# Patient Record
Sex: Female | Born: 1990 | Race: Black or African American | Hispanic: No | Marital: Single | Smoking: Former smoker
Health system: Southern US, Community
[De-identification: ages and names within clinical notes are randomized; demographics above are authoritative.]

## PROBLEM LIST (undated history)

## (undated) ENCOUNTER — Inpatient Hospital Stay (HOSPITAL_COMMUNITY): Payer: Self-pay

## (undated) DIAGNOSIS — A749 Chlamydial infection, unspecified: Secondary | ICD-10-CM

## (undated) DIAGNOSIS — B999 Unspecified infectious disease: Secondary | ICD-10-CM

## (undated) HISTORY — PX: INDUCED ABORTION: SHX677

---
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
## 2010-06-19 ENCOUNTER — Emergency Department (HOSPITAL_COMMUNITY): Payer: Self-pay

## 2010-06-19 ENCOUNTER — Emergency Department (HOSPITAL_COMMUNITY)
Admission: EM | Admit: 2010-06-19 | Discharge: 2010-06-19 | Disposition: A | Discharge status: Home / Self Care | Payer: Self-pay | Attending: Emergency Medicine | Admitting: Emergency Medicine

## 2010-06-19 DIAGNOSIS — J45909 Unspecified asthma, uncomplicated: Secondary | ICD-10-CM | POA: Insufficient documentation

## 2010-06-19 DIAGNOSIS — R112 Nausea with vomiting, unspecified: Secondary | ICD-10-CM | POA: Insufficient documentation

## 2010-06-19 DIAGNOSIS — R51 Headache: Secondary | ICD-10-CM | POA: Insufficient documentation

## 2010-06-19 DIAGNOSIS — R300 Dysuria: Secondary | ICD-10-CM | POA: Insufficient documentation

## 2010-06-19 DIAGNOSIS — F319 Bipolar disorder, unspecified: Secondary | ICD-10-CM | POA: Insufficient documentation

## 2010-06-19 DIAGNOSIS — R109 Unspecified abdominal pain: Secondary | ICD-10-CM | POA: Insufficient documentation

## 2010-06-19 DIAGNOSIS — H53149 Visual discomfort, unspecified: Secondary | ICD-10-CM | POA: Insufficient documentation

## 2010-06-19 LAB — URINALYSIS, ROUTINE W REFLEX MICROSCOPIC
Glucose, UA: NEGATIVE mg/dL
Specific Gravity, Urine: 1.015 (ref 1.005–1.030)

## 2010-06-19 LAB — URINE MICROSCOPIC-ADD ON

## 2010-06-19 LAB — DIFFERENTIAL
Basophils Relative: 0 % (ref 0–1)
Eosinophils Absolute: 0.1 10*3/uL (ref 0.0–0.7)
Neutrophils Relative %: 48 % (ref 43–77)

## 2010-06-19 LAB — CBC
MCH: 31 pg (ref 26.0–34.0)
Platelets: 258 10*3/uL (ref 150–400)
RBC: 4.23 MIL/uL (ref 3.87–5.11)
WBC: 6.7 10*3/uL (ref 4.0–10.5)

## 2010-06-19 LAB — BASIC METABOLIC PANEL
Chloride: 101 mEq/L (ref 96–112)
Creatinine, Ser: 0.66 mg/dL (ref 0.4–1.2)
GFR calc Af Amer: >60 mL/min (ref >60)
Potassium: 3.8 mEq/L (ref 3.5–5.1)

## 2010-06-20 LAB — URINE CULTURE

## 2010-06-24 LAB — URINALYSIS, ROUTINE W REFLEX MICROSCOPIC
Bilirubin Urine: NEGATIVE
Glucose, UA: NEGATIVE mg/dL
Specific Gravity, Urine: 1.005 (ref 1.005–1.030)
Urobilinogen, UA: 0.2 mg/dL (ref 0.0–1.0)

## 2010-06-24 LAB — COMPREHENSIVE METABOLIC PANEL
AST: 16 U/L (ref 0–37)
Albumin: 4.4 g/dL (ref 3.5–5.2)
Alkaline Phosphatase: 43 U/L (ref 39–117)
Chloride: 103 mEq/L (ref 96–112)
GFR calc Af Amer: >60 mL/min (ref >60)
Potassium: 3.2 mEq/L — ABNORMAL LOW (ref 3.5–5.1)
Sodium: 139 mEq/L (ref 135–145)
Total Bilirubin: 0.4 mg/dL (ref 0.3–1.2)
Total Protein: 7.5 g/dL (ref 6.0–8.3)

## 2010-06-24 LAB — CBC
MCV: 91.5 fL (ref 78.0–100.0)
Platelets: 212 10*3/uL (ref 150–400)
RBC: 3.79 MIL/uL — ABNORMAL LOW (ref 3.87–5.11)
WBC: 6.4 10*3/uL (ref 4.0–10.5)

## 2010-06-24 LAB — DIFFERENTIAL
Basophils Absolute: 0 10*3/uL (ref 0.0–0.1)
Basophils Relative: 0 % (ref 0–1)
Eosinophils Relative: 2 % (ref 0–5)
Monocytes Absolute: 0.5 10*3/uL (ref 0.1–1.0)
Monocytes Relative: 8 % (ref 3–12)

## 2010-06-24 LAB — URINE MICROSCOPIC-ADD ON

## 2010-06-24 LAB — POCT PREGNANCY, URINE: Preg Test, Ur: NEGATIVE

## 2010-06-25 LAB — URINALYSIS, ROUTINE W REFLEX MICROSCOPIC
Glucose, UA: NEGATIVE mg/dL
Ketones, ur: NEGATIVE mg/dL
Protein, ur: NEGATIVE mg/dL
pH: 7 (ref 5.0–8.0)

## 2010-06-25 LAB — CBC
Hemoglobin: 12.6 g/dL (ref 12.0–15.0)
MCH: 31.2 pg (ref 26.0–34.0)
MCV: 93.1 fL (ref 78.0–100.0)
RBC: 4.03 MIL/uL (ref 3.87–5.11)

## 2010-06-25 LAB — BASIC METABOLIC PANEL
CO2: 29 mEq/L (ref 19–32)
Chloride: 103 mEq/L (ref 96–112)
Creatinine, Ser: 0.56 mg/dL (ref 0.4–1.2)
GFR calc Af Amer: >60 mL/min (ref >60)
Sodium: 137 mEq/L (ref 135–145)

## 2010-06-25 LAB — DIFFERENTIAL
Eosinophils Absolute: 0 10*3/uL (ref 0.0–0.7)
Eosinophils Relative: 1 % (ref 0–5)
Lymphs Abs: 1.6 10*3/uL (ref 0.7–4.0)
Monocytes Relative: 3 % (ref 3–12)

## 2010-06-25 LAB — URINE CULTURE

## 2010-06-25 LAB — URINE MICROSCOPIC-ADD ON

## 2010-07-16 LAB — URINE MICROSCOPIC-ADD ON

## 2010-07-16 LAB — RAPID STREP SCREEN (MED CTR MEBANE ONLY): Streptococcus, Group A Screen (Direct): NEGATIVE

## 2010-07-16 LAB — STREP A DNA PROBE

## 2010-07-16 LAB — URINALYSIS, ROUTINE W REFLEX MICROSCOPIC
Nitrite: NEGATIVE
Specific Gravity, Urine: 1.015 (ref 1.005–1.030)
Urobilinogen, UA: 0.2 mg/dL (ref 0.0–1.0)

## 2010-07-16 LAB — PREGNANCY, URINE: Preg Test, Ur: NEGATIVE

## 2010-07-20 LAB — URINALYSIS, ROUTINE W REFLEX MICROSCOPIC
Bilirubin Urine: NEGATIVE
Glucose, UA: NEGATIVE mg/dL
Protein, ur: 100 mg/dL — AB

## 2010-07-20 LAB — URINE CULTURE

## 2010-07-20 LAB — URINE MICROSCOPIC-ADD ON

## 2010-07-26 LAB — URINALYSIS, ROUTINE W REFLEX MICROSCOPIC
Nitrite: NEGATIVE
Specific Gravity, Urine: 1.01 (ref 1.005–1.030)
Urobilinogen, UA: 0.2 mg/dL (ref 0.0–1.0)

## 2010-07-26 LAB — DIFFERENTIAL
Basophils Relative: 0 % (ref 0–1)
Eosinophils Absolute: 0.1 10*3/uL (ref 0.0–1.2)
Eosinophils Relative: 1 % (ref 0–5)
Monocytes Absolute: 0.6 10*3/uL (ref 0.2–1.2)
Monocytes Relative: 8 % (ref 3–11)

## 2010-07-26 LAB — CBC
Hemoglobin: 13.8 g/dL (ref 12.0–16.0)
MCHC: 33.6 g/dL (ref 31.0–37.0)
MCV: 92.4 fL (ref 78.0–98.0)
RBC: 4.45 MIL/uL (ref 3.80–5.70)

## 2010-07-26 LAB — BASIC METABOLIC PANEL
CO2: 29 mEq/L (ref 19–32)
Chloride: 101 mEq/L (ref 96–112)
Creatinine, Ser: 0.63 mg/dL (ref 0.4–1.2)
Sodium: 136 mEq/L (ref 135–145)

## 2010-07-26 LAB — RAPID URINE DRUG SCREEN, HOSP PERFORMED: Tetrahydrocannabinol: NOT DETECTED

## 2010-07-26 LAB — PREGNANCY, URINE: Preg Test, Ur: NEGATIVE

## 2010-07-26 LAB — URINE MICROSCOPIC-ADD ON

## 2010-07-27 ENCOUNTER — Emergency Department (HOSPITAL_COMMUNITY)
Admission: EM | Admit: 2010-07-27 | Discharge: 2010-07-27 | Disposition: A | Discharge status: Home / Self Care | Payer: Self-pay | Attending: Emergency Medicine | Admitting: Emergency Medicine

## 2010-07-27 ENCOUNTER — Emergency Department (HOSPITAL_COMMUNITY): Payer: Self-pay

## 2010-07-27 DIAGNOSIS — R071 Chest pain on breathing: Secondary | ICD-10-CM | POA: Insufficient documentation

## 2010-07-27 DIAGNOSIS — R079 Chest pain, unspecified: Secondary | ICD-10-CM | POA: Insufficient documentation

## 2010-07-27 LAB — DIFFERENTIAL
Basophils Absolute: 0 10*3/uL (ref 0.0–0.1)
Basophils Relative: 0 % (ref 0–1)
Eosinophils Relative: 2 % (ref 0–5)
Lymphocytes Relative: 50 % — ABNORMAL HIGH (ref 12–46)
Monocytes Absolute: 0.6 10*3/uL (ref 0.1–1.0)
Monocytes Relative: 10 % (ref 3–12)

## 2010-07-27 LAB — BASIC METABOLIC PANEL
CO2: 25 mEq/L (ref 19–32)
Chloride: 101 mEq/L (ref 96–112)
GFR calc Af Amer: >60 mL/min (ref >60)
Potassium: 3.4 mEq/L — ABNORMAL LOW (ref 3.5–5.1)
Sodium: 134 mEq/L — ABNORMAL LOW (ref 135–145)

## 2010-07-27 LAB — CBC
HCT: 36.1 % (ref 36.0–46.0)
MCH: 30.7 pg (ref 26.0–34.0)
MCHC: 34.1 g/dL (ref 30.0–36.0)
RDW: 12.3 % (ref 11.5–15.5)

## 2010-07-27 LAB — URINE MICROSCOPIC-ADD ON

## 2010-07-27 LAB — URINALYSIS, ROUTINE W REFLEX MICROSCOPIC
Glucose, UA: NEGATIVE mg/dL
Leukocytes, UA: NEGATIVE
Protein, ur: NEGATIVE mg/dL
Specific Gravity, Urine: <1.005 — ABNORMAL LOW (ref 1.005–1.030)
pH: 6.5 (ref 5.0–8.0)

## 2010-07-27 LAB — HEPATIC FUNCTION PANEL
Albumin: 4.4 g/dL (ref 3.5–5.2)
Total Protein: 7.2 g/dL (ref 6.0–8.3)

## 2010-07-27 LAB — POCT CARDIAC MARKERS: Myoglobin, poc: 45.4 ng/mL (ref 12–200)

## 2010-07-27 LAB — LIPASE, BLOOD: Lipase: 26 U/L (ref 11–59)

## 2010-09-07 ENCOUNTER — Emergency Department (HOSPITAL_COMMUNITY)
Admission: EM | Admit: 2010-09-07 | Discharge: 2010-09-08 | Discharge status: Against Medical Advice | Payer: Self-pay | Attending: Emergency Medicine | Admitting: Emergency Medicine

## 2010-09-07 DIAGNOSIS — L259 Unspecified contact dermatitis, unspecified cause: Secondary | ICD-10-CM | POA: Insufficient documentation

## 2010-09-07 DIAGNOSIS — F319 Bipolar disorder, unspecified: Secondary | ICD-10-CM | POA: Insufficient documentation

## 2010-09-07 DIAGNOSIS — T63461A Toxic effect of venom of wasps, accidental (unintentional), initial encounter: Secondary | ICD-10-CM | POA: Insufficient documentation

## 2010-09-07 DIAGNOSIS — T6391XA Toxic effect of contact with unspecified venomous animal, accidental (unintentional), initial encounter: Secondary | ICD-10-CM | POA: Insufficient documentation

## 2010-09-07 DIAGNOSIS — J45909 Unspecified asthma, uncomplicated: Secondary | ICD-10-CM | POA: Insufficient documentation

## 2010-09-08 ENCOUNTER — Emergency Department (HOSPITAL_COMMUNITY)
Admission: EM | Admit: 2010-09-08 | Discharge: 2010-09-08 | Disposition: A | Discharge status: Home / Self Care | Payer: Self-pay | Attending: Emergency Medicine | Admitting: Emergency Medicine

## 2010-09-08 DIAGNOSIS — T63461A Toxic effect of venom of wasps, accidental (unintentional), initial encounter: Secondary | ICD-10-CM | POA: Insufficient documentation

## 2010-09-08 DIAGNOSIS — L259 Unspecified contact dermatitis, unspecified cause: Secondary | ICD-10-CM | POA: Insufficient documentation

## 2010-09-08 DIAGNOSIS — T6391XA Toxic effect of contact with unspecified venomous animal, accidental (unintentional), initial encounter: Secondary | ICD-10-CM | POA: Insufficient documentation

## 2011-01-06 LAB — URINALYSIS, ROUTINE W REFLEX MICROSCOPIC
Bilirubin Urine: NEGATIVE
Glucose, UA: NEGATIVE
Ketones, ur: NEGATIVE
Protein, ur: NEGATIVE

## 2011-01-06 LAB — URINE MICROSCOPIC-ADD ON

## 2011-02-19 ENCOUNTER — Encounter (HOSPITAL_COMMUNITY): Payer: Self-pay

## 2011-02-19 ENCOUNTER — Inpatient Hospital Stay (HOSPITAL_COMMUNITY)
Admission: AD | Admit: 2011-02-19 | Discharge: 2011-02-19 | Disposition: A | Discharge status: Home / Self Care | Payer: Self-pay | Source: Ambulatory Visit | Attending: Obstetrics & Gynecology | Admitting: Obstetrics & Gynecology

## 2011-02-19 DIAGNOSIS — B373 Candidiasis of vulva and vagina: Secondary | ICD-10-CM

## 2011-02-19 DIAGNOSIS — N949 Unspecified condition associated with female genital organs and menstrual cycle: Secondary | ICD-10-CM | POA: Insufficient documentation

## 2011-02-19 DIAGNOSIS — B3731 Acute candidiasis of vulva and vagina: Secondary | ICD-10-CM | POA: Insufficient documentation

## 2011-02-19 LAB — URINALYSIS, ROUTINE W REFLEX MICROSCOPIC
Bilirubin Urine: NEGATIVE
Nitrite: NEGATIVE
Specific Gravity, Urine: 1.02 (ref 1.005–1.030)
Urobilinogen, UA: 0.2 mg/dL (ref 0.0–1.0)

## 2011-02-19 LAB — WET PREP, GENITAL
Clue Cells Wet Prep HPF POC: NONE SEEN
Trich, Wet Prep: NONE SEEN
Yeast Wet Prep HPF POC: NONE SEEN

## 2011-02-19 LAB — URINE MICROSCOPIC-ADD ON

## 2011-02-19 LAB — POCT PREGNANCY, URINE: Preg Test, Ur: NEGATIVE

## 2011-02-19 MED ORDER — FLUCONAZOLE 150 MG PO TABS: ORAL_TABLET | ORAL | Status: DC

## 2011-02-19 NOTE — ED Provider Notes (Signed)
History     CSN: [id number] Arrival date & time: 02/19/2011  1:28 PM   None     Chief Complaint  Patient presents with  . Vaginal Pain    (Consider location/radiation/quality/duration/timing/severity/associated sxs/prior treatment) HPI ALEJANDRO ADCOX is a 20 y.o.  G1P0010. She presents with c/o perineal pain. Feels sharp pain in vagina since last intercourse 2 da go. Same partner x 2 yr. No change in discharge, odor or itching. Has frequency and burning.  Past Medical History  Diagnosis Date  . No pertinent past medical history     No past surgical history on file.  No family history on file.  History  Substance Use Topics  . Smoking status: Not on file  . Smokeless tobacco: Not on file  . Alcohol Use:     OB History    Grav Para Term Preterm Abortions TAB SAB Ect Mult Living   1    1 1           Review of Systems  Constitutional: Negative for fever and chills.  Gastrointestinal: Negative.   Genitourinary: Positive for dysuria, frequency and vaginal pain. Negative for vaginal bleeding, vaginal discharge, genital sores and dyspareunia.    Allergies  Review of patient's allergies indicates not on file.  Home Medications  No current outpatient prescriptions on file.  BP 104/74  Pulse 78  Temp(Src) 98.8 F (37.1 C) (Oral)  Ht 5' 4.75" (1.645 m)  Wt 51.71 kg (114 lb)  BMI 19.12 kg/m2  LMP 02/16/2011  Physical Exam  Constitutional: She is oriented to person, place, and time. She appears well-developed and well-nourished.  Abdominal: Soft. There is no tenderness.  Genitourinary: There is erythema and tenderness around the vagina. No foreign body around the vagina.       Multiple tender linear abrasions anterior to clitoris. Unable to insert speculum fully due to discomfort and guarding. Large amt thick chunky white discharge in vagina.  Neurological: She is alert and oriented to person, place, and time.  Skin: Skin is warm and dry.  Psychiatric: She has a  normal mood and affect. Her behavior is normal.    ED Course  Procedures (including critical care time)   Labs Reviewed  POCT PREGNANCY, URINE  POCT URINE PREGNANCY  URINALYSIS, ROUTINE W REFLEX MICROSCOPIC  WET PREP, GENITAL  GC/CHLAMYDIA PROBE AMP, GENITAL   No results found. Results for orders placed during the hospital encounter of 02/19/11 (from the past 24 hour(s))  URINALYSIS, ROUTINE W REFLEX MICROSCOPIC     Status: Abnormal   Collection Time   02/19/11  1:45 PM      Component Value Range   Color, Urine YELLOW  YELLOW    Appearance CLEAR  CLEAR    Specific Gravity, Urine 1.020  1.005 - 1.030    pH 7.0  5.0 - 8.0    Glucose, UA NEGATIVE  NEGATIVE (mg/dL)   Hgb urine dipstick TRACE (*) NEGATIVE    Bilirubin Urine NEGATIVE  NEGATIVE    Ketones, ur NEGATIVE  NEGATIVE (mg/dL)   Protein, ur NEGATIVE  NEGATIVE (mg/dL)   Urobilinogen, UA 0.2  0.0 - 1.0 (mg/dL)   Nitrite NEGATIVE  NEGATIVE    Leukocytes, UA TRACE (*) NEGATIVE   URINE MICROSCOPIC-ADD ON     Status: Abnormal   Collection Time   02/19/11  1:45 PM      Component Value Range   Squamous Epithelial / LPF FEW (*) RARE    WBC, UA 3-6  <3 (WBC/hpf)  RBC / HPF 0-2  <3 (RBC/hpf)   Bacteria, UA FEW (*) RARE   POCT PREGNANCY, URINE     Status: Normal   Collection Time   02/19/11  1:53 PM      Component Value Range   Preg Test, Ur NEGATIVE    WET PREP, GENITAL     Status: Abnormal   Collection Time   02/19/11  2:05 PM      Component Value Range   Yeast, Wet Prep NONE SEEN  NONE SEEN    Trich, Wet Prep NONE SEEN  NONE SEEN    Clue Cells, Wet Prep NONE SEEN  NONE SEEN    WBC, Wet Prep HPF POC MODERATE (*) NONE SEEN       No diagnosis found. ASSESSMENT:  Symptomatic yeast STD cultures pending  PLAN:  Difulcan 150 mg 1 po, repeat in 3 days if needed. Pt to call HD in [place] to have Implanon removed and replaced.     MDM          Avon Gully. Davonna Ertl 02/19/11 1514

## 2011-02-19 NOTE — Progress Notes (Signed)
Patient had recent intercourse feels like vaginal area is split, very irritated, same partner, no discharge.

## 2011-02-19 NOTE — Progress Notes (Signed)
Patient reports has Implanon and this past month had bleeding 3 times, has had in place for 3 years.

## 2011-02-22 LAB — GC/CHLAMYDIA PROBE AMP, URINE: GC Probe Amp, Urine: NEGATIVE

## 2011-02-23 ENCOUNTER — Telehealth (HOSPITAL_COMMUNITY): Payer: Self-pay | Admitting: *Deleted

## 2011-02-23 NOTE — Telephone Encounter (Signed)
Patient returned call, notified her of positive chlamydia culture.  Instructed patient to schedule treatment with [facility] at [phone].  Instructed patient to notify her partner for treatment.  

## 2011-02-23 NOTE — Telephone Encounter (Signed)
Telephone call to patient regarding positive chlamydia culture, patient not in, left message for patient to call.  Patient has not been treated and will need referral to [facility] at [phone].  Report faxed to health department.

## 2011-04-26 ENCOUNTER — Emergency Department (HOSPITAL_COMMUNITY)
Admission: EM | Admit: 2011-04-26 | Discharge: 2011-04-26 | Disposition: A | Discharge status: Home / Self Care | Payer: Self-pay | Attending: Emergency Medicine | Admitting: Emergency Medicine

## 2011-04-26 ENCOUNTER — Encounter (HOSPITAL_COMMUNITY): Payer: Self-pay | Admitting: *Deleted

## 2011-04-26 DIAGNOSIS — S139XXA Sprain of joints and ligaments of unspecified parts of neck, initial encounter: Secondary | ICD-10-CM | POA: Insufficient documentation

## 2011-04-26 DIAGNOSIS — T733XXA Exhaustion due to excessive exertion, initial encounter: Secondary | ICD-10-CM | POA: Insufficient documentation

## 2011-04-26 DIAGNOSIS — M25519 Pain in unspecified shoulder: Secondary | ICD-10-CM | POA: Insufficient documentation

## 2011-04-26 DIAGNOSIS — R209 Unspecified disturbances of skin sensation: Secondary | ICD-10-CM | POA: Insufficient documentation

## 2011-04-26 DIAGNOSIS — S161XXA Strain of muscle, fascia and tendon at neck level, initial encounter: Secondary | ICD-10-CM

## 2011-04-26 DIAGNOSIS — R51 Headache: Secondary | ICD-10-CM | POA: Insufficient documentation

## 2011-04-26 DIAGNOSIS — IMO0001 Reserved for inherently not codable concepts without codable children: Secondary | ICD-10-CM | POA: Insufficient documentation

## 2011-04-26 LAB — POCT PREGNANCY, URINE: Preg Test, Ur: NEGATIVE

## 2011-04-26 MED ORDER — KETOROLAC TROMETHAMINE 60 MG/2ML IM SOLN
60.0000 mg | Freq: Once | INTRAMUSCULAR | Status: AC
  Administered 2011-04-26: 60 mg via INTRAMUSCULAR
  Filled 2011-04-26: qty 2

## 2011-04-26 MED ORDER — IBUPROFEN 600 MG PO TABS: 600.0000 mg | ORAL_TABLET | Freq: Four times a day (QID) | ORAL | Status: AC | PRN

## 2011-04-26 MED ORDER — CYCLOBENZAPRINE HCL 5 MG PO TABS: 5.0000 mg | ORAL_TABLET | Freq: Three times a day (TID) | ORAL | Status: AC | PRN

## 2011-04-26 NOTE — ED Notes (Signed)
C/o generalized body aches for 3 days and HA

## 2011-04-26 NOTE — ED Provider Notes (Signed)
History     CSN: [id number]  Arrival date & time 04/26/11  Rickey Primus   First MD Initiated Contact with Patient 04/26/11 1833      Chief Complaint  Patient presents with  . Generalized Body Aches  . Headache    (Consider location/radiation/quality/duration/timing/severity/associated sxs/prior treatment) HPI Comments: Patient presents with a three-day history of generalized myalgias, but her symptoms are most problematic in her left neck and shoulder area. She describes radiation of a burning/tingling sensation that radiates into her left upper arm. She denies neck injury or pain, but has noticed muscle pain in her left shoulder area. She denies trauma, fevers, weakness or numbness in her upper extremities. She'll also describes generalized mild headache. Denies fever, cough, congestion or other URI type symptoms. She does report having increased physical stress at work, and has been working 10 hour shifts 7 days a week for the past several months. Denies chest pain, shortness of breath, nausea and vomiting. She has taken ibuprofen with no relief of symptoms. Rest improves her pain, worsened with movement and range of motion of her neck and left shoulder.  The history is provided by the patient.    Past Medical History  Diagnosis Date  . No pertinent past medical history     History reviewed. No pertinent past surgical history.  Family History  Problem Relation Age of Onset  . Stroke Mother   . Hypertension Father   . Stroke Father   . Diabetes Father     History  Substance Use Topics  . Smoking status: Never Smoker   . Smokeless tobacco: Never Used  . Alcohol Use: No    OB History    Grav Para Term Preterm Abortions TAB SAB Ect Mult Living   1    1 1           Review of Systems  Constitutional: Negative for fever.  HENT: Negative for congestion, sore throat and neck pain.   Eyes: Negative.   Respiratory: Negative for chest tightness and shortness of breath.     Cardiovascular: Negative for chest pain.  Gastrointestinal: Negative for nausea and abdominal pain.  Genitourinary: Negative.   Musculoskeletal: Positive for arthralgias. Negative for joint swelling.  Skin: Negative.  Negative for rash and wound.  Neurological: Negative for dizziness, weakness, light-headedness, numbness and headaches.  Hematological: Negative.   Psychiatric/Behavioral: Negative.     Allergies  Review of patient's allergies indicates no known allergies.  Home Medications   Current Outpatient Rx  Name Route Sig Dispense Refill  . ASPIRIN 325 MG PO TABS Oral Take 325 mg by mouth as needed. For pain    . IBUPROFEN 200 MG PO TABS Oral Take 800 mg by mouth daily as needed. For pain    . CYCLOBENZAPRINE HCL 5 MG PO TABS Oral Take 1 tablet (5 mg total) by mouth 3 (three) times daily as needed for muscle spasms. 20 tablet 0  . IBUPROFEN 600 MG PO TABS Oral Take 1 tablet (600 mg total) by mouth every 6 (six) hours as needed for pain. 30 tablet 0    BP 141/95  Pulse 94  Temp(Src) 98.1 F (36.7 C) (Oral)  Resp 18  SpO2 100%  LMP 04/22/2011  Physical Exam  Nursing note and vitals reviewed. Constitutional: She is oriented to person, place, and time. She appears well-developed and well-nourished.       Uncomfortable appearing  HENT:  Head: Normocephalic and atraumatic.  Mouth/Throat: Oropharynx is clear and moist.  Eyes: EOM  are normal. Pupils are equal, round, and reactive to light.  Neck: Normal range of motion. Neck supple.  Cardiovascular: Normal rate and normal heart sounds.   Pulmonary/Chest: Effort normal.  Abdominal: Soft. There is no tenderness.  Musculoskeletal: Normal range of motion. She exhibits tenderness. She exhibits no edema.       Back:       ttp left trapezius with muscle spasm appreciated.  No cervical midline tenderness.  Lymphadenopathy:    She has no cervical adenopathy.  Neurological: She is alert and oriented to person, place, and time.  She has normal strength. No cranial nerve deficit or sensory deficit. She displays a negative Romberg sign. Gait normal. GCS eye subscore is 4. GCS verbal subscore is 5. GCS motor subscore is 6.       Normal heel-shin, normal rapid alternating movements.  Skin: Skin is warm and dry. No rash noted.  Psychiatric: She has a normal mood and affect. Her speech is normal and behavior is normal. Thought content normal. Cognition and memory are normal.    ED Course  Procedures (including critical care time)  Labs Reviewed  GLUCOSE, CAPILLARY - Abnormal; Notable for the following:    Glucose-Capillary 101 (*)    All other components within normal limits  POCT PREGNANCY, URINE  POCT CBG MONITORING  POCT PREGNANCY, URINE   No results found.   1. Cervical muscle strain       MDM  Ibuprofen, Flexeril. Heat  therapy to neck and shoulder area. Referral to Dr. Felecia Shelling to establish primary medical care and for followup of this complaints if not improved.       Candis Musa, PA 04/27/11 [phone]

## 2011-04-26 NOTE — ED Notes (Signed)
EDPa in to evaluate and assess pt.

## 2011-04-26 NOTE — ED Notes (Signed)
Pt reports her headache is better but her L arm is still burning.

## 2011-04-27 NOTE — ED Provider Notes (Signed)
Medical screening examination/treatment/procedure(s) were performed by non-physician practitioner and as supervising physician I was immediately available for consultation/collaboration.  Decie Verne R. Jaquetta Currier, MD 04/27/11 1436 

## 2011-05-19 ENCOUNTER — Encounter (HOSPITAL_COMMUNITY): Payer: Self-pay | Admitting: Emergency Medicine

## 2011-05-19 ENCOUNTER — Emergency Department (HOSPITAL_COMMUNITY)
Admission: EM | Admit: 2011-05-19 | Discharge: 2011-05-19 | Disposition: A | Discharge status: Home / Self Care | Payer: Self-pay | Attending: Emergency Medicine | Admitting: Emergency Medicine

## 2011-05-19 DIAGNOSIS — G5600 Carpal tunnel syndrome, unspecified upper limb: Secondary | ICD-10-CM | POA: Insufficient documentation

## 2011-05-19 DIAGNOSIS — M129 Arthropathy, unspecified: Secondary | ICD-10-CM | POA: Insufficient documentation

## 2011-05-19 DIAGNOSIS — M79609 Pain in unspecified limb: Secondary | ICD-10-CM | POA: Insufficient documentation

## 2011-05-19 MED ORDER — PREDNISONE 20 MG PO TABS: 40.0000 mg | ORAL_TABLET | Freq: Every day | ORAL | Status: DC

## 2011-05-19 MED ORDER — OXYCODONE-ACETAMINOPHEN 7.5-325 MG PO TABS
1.0000 | ORAL_TABLET | ORAL | Status: AC | PRN
Start: 2011-05-19 — End: 2011-05-29

## 2011-05-19 NOTE — ED Notes (Addendum)
Pt states she was seen a month ago for pain in L arm.states  She was told it could be a pinched nerve or arthritis. Denies injury.states she was seen last night and given a shot.now pain is back.

## 2011-05-19 NOTE — Progress Notes (Signed)
Orthopedic Tech Progress Note Patient Details:  Kristy Little 1990/10/26 [id number]  Type of Splint: Other (comment) (6" velcro wrist splint) Splint Location: (L) UE Splint Interventions: Application    Jennye Moccasin 05/19/2011, 6:15 PM

## 2011-05-19 NOTE — ED Provider Notes (Addendum)
History     CSN: [id number]  Arrival date & time 05/19/11  1621   First MD Initiated Contact with Patient 05/19/11 1742      Chief Complaint  Patient presents with  . Arm Pain    (Consider location/radiation/quality/duration/timing/severity/associated sxs/prior treatment) HPI Patient complains of left arm pain for several weeks to months now.  Seen at any p.m. and told she might have arthritis.  Describes the pain is burning awakes her at night and was associated with some hand and finger swelling.  Patient works at [employer] and  has repetitive motion movements throughout the day. Past Medical History  Diagnosis Date  . No pertinent past medical history   . Arthritis     History reviewed. No pertinent past surgical history.  Family History  Problem Relation Age of Onset  . Stroke Mother   . Hypertension Father   . Stroke Father   . Diabetes Father     History  Substance Use Topics  . Smoking status: Never Smoker   . Smokeless tobacco: Never Used  . Alcohol Use: No    OB History    Grav Para Term Preterm Abortions TAB SAB Ect Mult Living   1    1 1           Review of Systems Negative except as noted in history of present illness Allergies  Review of patient's allergies indicates no known allergies.  Home Medications   Current Outpatient Rx  Name Route Sig Dispense Refill  . ACETAMINOPHEN 500 MG PO TABS Oral Take 500 mg by mouth every 6 (six) hours as needed. For pain    . MELOXICAM 15 MG PO TABS Oral Take 15 mg by mouth 3 (three) times daily as needed. For pain    . OXYCODONE-ACETAMINOPHEN 7.5-325 MG PO TABS Oral Take 1 tablet by mouth every 4 (four) hours as needed for pain. 30 tablet 0    BP 112/61  Pulse 79  Temp(Src) 97.7 F (36.5 C) (Oral)  Resp 16  SpO2 100%  LMP 04/22/2011  Physical Exam  Nursing note and vitals reviewed. Constitutional: She is oriented to person, place, and time. She appears well-developed and well-nourished. No distress.    HENT:  Head: Normocephalic and atraumatic.  Eyes: Pupils are equal, round, and reactive to light.  Neck: Normal range of motion.  Cardiovascular: Normal rate and intact distal pulses.   Pulmonary/Chest: No respiratory distress.  Abdominal: Normal appearance. She exhibits no distension.  Musculoskeletal: Normal range of motion.       Decr two point discrimination along median nerve distribution  Carpal compression test positive  Neurological: She is alert and oriented to person, place, and time. No cranial nerve deficit.  Skin: Skin is warm and dry. No rash noted.  Psychiatric: She has a normal mood and affect. Her behavior is normal.    ED Course  Procedures (including critical care time)  Labs Reviewed - No data to display No results found.   1. Carpal tunnel syndrome       MDM         Nelia Shi, MD 05/19/11 1758  Nelia Shi, MD 05/19/11 [phone]

## 2011-05-19 NOTE — ED Notes (Signed)
Pt c/o left arm pain x several months; pt sts seen at [facility] for same and told had OA

## 2011-06-26 ENCOUNTER — Encounter (HOSPITAL_COMMUNITY): Payer: Self-pay | Admitting: Emergency Medicine

## 2011-06-26 ENCOUNTER — Emergency Department (HOSPITAL_COMMUNITY)
Admission: EM | Admit: 2011-06-26 | Discharge: 2011-06-26 | Disposition: A | Discharge status: Home / Self Care | Payer: Self-pay | Attending: Emergency Medicine | Admitting: Emergency Medicine

## 2011-06-26 DIAGNOSIS — R51 Headache: Secondary | ICD-10-CM

## 2011-06-26 DIAGNOSIS — G43909 Migraine, unspecified, not intractable, without status migrainosus: Secondary | ICD-10-CM | POA: Insufficient documentation

## 2011-06-26 MED ORDER — SODIUM CHLORIDE 0.9 % IV BOLUS (SEPSIS)
1000.0000 mL | Freq: Once | INTRAVENOUS | Status: AC
  Administered 2011-06-26: 1000 mL via INTRAVENOUS

## 2011-06-26 MED ORDER — DIPHENHYDRAMINE HCL 50 MG/ML IJ SOLN
12.5000 mg | Freq: Once | INTRAMUSCULAR | Status: AC
  Administered 2011-06-26: 12.5 mg via INTRAVENOUS
  Filled 2011-06-26: qty 1

## 2011-06-26 MED ORDER — KETOROLAC TROMETHAMINE 30 MG/ML IJ SOLN
30.0000 mg | Freq: Once | INTRAMUSCULAR | Status: AC
  Administered 2011-06-26: 30 mg via INTRAVENOUS
  Filled 2011-06-26: qty 1

## 2011-06-26 MED ORDER — METOCLOPRAMIDE HCL 5 MG/ML IJ SOLN
10.0000 mg | Freq: Once | INTRAMUSCULAR | Status: AC
  Administered 2011-06-26: 10 mg via INTRAVENOUS
  Filled 2011-06-26: qty 2

## 2011-06-26 NOTE — ED Provider Notes (Signed)
History     CSN: [id number]  Arrival date & time 06/26/11  0711   First MD Initiated Contact with Patient 06/26/11 [phone]      Chief Complaint  Patient presents with  . Migraine    (Consider location/radiation/quality/duration/timing/severity/associated sxs/prior treatment) HPI  21 year old female with history of migraine headaches presents with chief complaints of headache. Patient states for the past 3 days she has had gradual onset of headache. Pain is located to both temple. Describe pain as a throbbing, pounding sensation. Headache is persistent, worsened with laying flat, and improves with sitting up. This morning patient notice that her vision is blurred to both eyes. They she can tell us that the room is bright versus dark, however everything else is blurry.  This is new for her. She does mention that her head is hot but denies fever, or chills. Patient denies nausea, vomiting, abdominal pain, dysuria, sneezing, cough, chest pain, shortness of breath, or rash. She does not know what precipitates the symptoms. Tylenol and ibuprofen at home without relief. She denies alcohol or recreational drug use.  Past Medical History  Diagnosis Date  . No pertinent past medical history     History reviewed. No pertinent past surgical history.  Family History  Problem Relation Age of Onset  . Stroke Mother   . Hypertension Father   . Stroke Father   . Diabetes Father     History  Substance Use Topics  . Smoking status: Never Smoker   . Smokeless tobacco: Never Used  . Alcohol Use: No    OB History    Grav Para Term Preterm Abortions TAB SAB Ect Mult Living   1    1 1           Review of Systems  All other systems reviewed and are negative.    Allergies  Review of patient's allergies indicates no known allergies.  Home Medications   Current Outpatient Rx  Name Route Sig Dispense Refill  . ACETAMINOPHEN 500 MG PO TABS Oral Take 1,000 mg by mouth every 6 (six) hours as  needed. For pain      BP 108/73  Pulse 76  Temp(Src) 98.1 F (36.7 C) (Oral)  SpO2 99%  LMP 06/24/2011  Physical Exam  Nursing note and vitals reviewed. Constitutional: She appears well-developed and well-nourished.  HENT:  Head: Atraumatic.  Eyes: Conjunctivae and EOM are normal. Pupils are equal, round, and reactive to light. Right eye exhibits no discharge. Left eye exhibits no discharge. No scleral icterus.  Neck: Normal range of motion. Neck supple. No Brudzinski's sign and no Kernig's sign noted.  Cardiovascular: Normal rate and regular rhythm.   Pulmonary/Chest: Effort normal and breath sounds normal. She exhibits no tenderness.  Abdominal: Soft. She exhibits no distension. There is no tenderness.  Musculoskeletal: Normal range of motion.  Lymphadenopathy:    She has no cervical adenopathy.  Neurological: She is alert. She has normal strength. No sensory deficit. GCS eye subscore is 4. GCS verbal subscore is 5. GCS motor subscore is 6.  Skin: Skin is warm.  Psychiatric: She has a normal mood and affect.    ED Course  Procedures (including critical care time)  Labs Reviewed - No data to display No results found.   No diagnosis found.    MDM  Gradual onset of headache and a patient has history of migraine. She has no meningismal signs. She is afebrile. She does complain of blurred vision and appears to be tearful. We will  obtain a visual acuity test. Migraine cocktail given.    8:19 AM Patient complaining of blurred vision. On exam, PERRL, EOMI.  Visual acuity were unable to performed as pt sts vision too blurry.  Pt has prior head CT scan 1 year ago due to headache, which shows no acute abnormality, but does show a low lying cerebellar tonsils.  I discussed with my attending, who recommend continue with migraine cocktail and reassess. No further imaging needed during this exam.       11:04 AM On reevaluation, patient states her vision has returned. She does  mention that her headache has improved significantly. Patient felt comfortable to be discharged. I recommend for her to follow with her primary care Dr. for further evaluation.  Fayrene Helper, PA-C 06/26/11 1105

## 2011-06-26 NOTE — Discharge Instructions (Signed)
Headaches, Frequently Asked Questions MIGRAINE HEADACHES Q: What is migraine? What causes it? How can I treat it? A: Generally, migraine headaches begin as a dull ache. Then they develop into a constant, throbbing, and pulsating pain. You may experience pain at the temples. You may experience pain at the front or back of one or both sides of the head. The pain is usually accompanied by a combination of:  Nausea.   Vomiting.   Sensitivity to light and noise.  Some people (about 15%) experience an aura (see below) before an attack. The cause of migraine is believed to be chemical reactions in the brain. Treatment for migraine may include over-the-counter or prescription medications. It may also include self-help techniques. These include relaxation training and biofeedback.  Q: What is an aura? A: About 15% of people with migraine get an "aura". This is a sign of neurological symptoms that occur before a migraine headache. You may see wavy or jagged lines, dots, or flashing lights. You might experience tunnel vision or blind spots in one or both eyes. The aura can include visual or auditory hallucinations (something imagined). It may include disruptions in smell (such as strange odors), taste or touch. Other symptoms include:  Numbness.   A "pins and needles" sensation.   Difficulty in recalling or speaking the correct word.  These neurological events may last as long as 60 minutes. These symptoms will fade as the headache begins. Q: What is a trigger? A: Certain physical or environmental factors can lead to or "trigger" a migraine. These include:  Foods.   Hormonal changes.   Weather.   Stress.  It is important to remember that triggers are different for everyone. To help prevent migraine attacks, you need to figure out which triggers affect you. Keep a headache diary. This is a good way to track triggers. The diary will help you talk to your healthcare professional about your  condition. Q: Does weather affect migraines? A: Bright sunshine, hot, humid conditions, and drastic changes in barometric pressure may lead to, or "trigger," a migraine attack in some people. But studies have shown that weather does not act as a trigger for everyone with migraines. Q: What is the link between migraine and hormones? A: Hormones start and regulate many of your body's functions. Hormones keep your body in balance within a constantly changing environment. The levels of hormones in your body are unbalanced at times. Examples are during menstruation, pregnancy, or menopause. That can lead to a migraine attack. In fact, about three quarters of all women with migraine report that their attacks are related to the menstrual cycle.  Q: Is there an increased risk of stroke for migraine sufferers? A: The likelihood of a migraine attack causing a stroke is very remote. That is not to say that migraine sufferers cannot have a stroke associated with their migraines. In persons under age 40, the most common associated factor for stroke is migraine headache. But over the course of a person's normal life span, the occurrence of migraine headache may actually be associated with a reduced risk of dying from cerebrovascular disease due to stroke.  Q: What are acute medications for migraine? A: Acute medications are used to treat the pain of the headache after it has started. Examples over-the-counter medications, NSAIDs, ergots, and triptans.  Q: What are the triptans? A: Triptans are the newest class of abortive medications. They are specifically targeted to treat migraine. Triptans are vasoconstrictors. They moderate some chemical reactions in the brain.   The triptans work on receptors in your brain. Triptans help to restore the balance of a neurotransmitter called serotonin. Fluctuations in levels of serotonin are thought to be a main cause of migraine.  Q: Are over-the-counter medications for migraine  effective? A: Over-the-counter, or "OTC," medications may be effective in relieving mild to moderate pain and associated symptoms of migraine. But you should see your caregiver before beginning any treatment regimen for migraine.  Q: What are preventive medications for migraine? A: Preventive medications for migraine are sometimes referred to as "prophylactic" treatments. They are used to reduce the frequency, severity, and length of migraine attacks. Examples of preventive medications include antiepileptic medications, antidepressants, beta-blockers, calcium channel blockers, and NSAIDs (nonsteroidal anti-inflammatory drugs). Q: Why are anticonvulsants used to treat migraine? A: During the past few years, there has been an increased interest in antiepileptic drugs for the prevention of migraine. They are sometimes referred to as "anticonvulsants". Both epilepsy and migraine may be caused by similar reactions in the brain.  Q: Why are antidepressants used to treat migraine? A: Antidepressants are typically used to treat people with depression. They may reduce migraine frequency by regulating chemical levels, such as serotonin, in the brain.  Q: What alternative therapies are used to treat migraine? A: The term "alternative therapies" is often used to describe treatments considered outside the scope of conventional Western medicine. Examples of alternative therapy include acupuncture, acupressure, and yoga. Another common alternative treatment is herbal therapy. Some herbs are believed to relieve headache pain. Always discuss alternative therapies with your caregiver before proceeding. Some herbal products contain arsenic and other toxins. TENSION HEADACHES Q: What is a tension-type headache? What causes it? How can I treat it? A: Tension-type headaches occur randomly. They are often the result of temporary stress, anxiety, fatigue, or anger. Symptoms include soreness in your temples, a tightening  band-like sensation around your head (a "vice-like" ache). Symptoms can also include a pulling feeling, pressure sensations, and contracting head and neck muscles. The headache begins in your forehead, temples, or the back of your head and neck. Treatment for tension-type headache may include over-the-counter or prescription medications. Treatment may also include self-help techniques such as relaxation training and biofeedback. CLUSTER HEADACHES Q: What is a cluster headache? What causes it? How can I treat it? A: Cluster headache gets its name because the attacks come in groups. The pain arrives with little, if any, warning. It is usually on one side of the head. A tearing or bloodshot eye and a runny nose on the same side of the headache may also accompany the pain. Cluster headaches are believed to be caused by chemical reactions in the brain. They have been described as the most severe and intense of any headache type. Treatment for cluster headache includes prescription medication and oxygen. SINUS HEADACHES Q: What is a sinus headache? What causes it? How can I treat it? A: When a cavity in the bones of the face and skull (a sinus) becomes inflamed, the inflammation will cause localized pain. This condition is usually the result of an allergic reaction, a tumor, or an infection. If your headache is caused by a sinus blockage, such as an infection, you will probably have a fever. An x-ray will confirm a sinus blockage. Your caregiver's treatment might include antibiotics for the infection, as well as antihistamines or decongestants.  REBOUND HEADACHES Q: What is a rebound headache? What causes it? How can I treat it? A: A pattern of taking acute headache medications too   often can lead to a condition known as "rebound headache." A pattern of taking too much headache medication includes taking it more than 2 days per week or in excessive amounts. That means more than the label or a caregiver advises.  With rebound headaches, your medications not only stop relieving pain, they actually begin to cause headaches. Doctors treat rebound headache by tapering the medication that is being overused. Sometimes your caregiver will gradually substitute a different type of treatment or medication. Stopping may be a challenge. Regularly overusing a medication increases the potential for serious side effects. Consult a caregiver if you regularly use headache medications more than 2 days per week or more than the label advises. ADDITIONAL QUESTIONS AND ANSWERS Q: What is biofeedback? A: Biofeedback is a self-help treatment. Biofeedback uses special equipment to monitor your body's involuntary physical responses. Biofeedback monitors:  Breathing.   Pulse.   Heart rate.   Temperature.   Muscle tension.   Brain activity.  Biofeedback helps you refine and perfect your relaxation exercises. You learn to control the physical responses that are related to stress. Once the technique has been mastered, you do not need the equipment any more. Q: Are headaches hereditary? A: Four out of five (80%) of people that suffer report a family history of migraine. Scientists are not sure if this is genetic or a family predisposition. Despite the uncertainty, a child has a 50% chance of having migraine if one parent suffers. The child has a 75% chance if both parents suffer.  Q: Can children get headaches? A: By the time they reach high school, most young people have experienced some type of headache. Many safe and effective approaches or medications can prevent a headache from occurring or stop it after it has begun.  Q: What type of doctor should I see to diagnose and treat my headache? A: Start with your primary caregiver. Discuss his or her experience and approach to headaches. Discuss methods of classification, diagnosis, and treatment. Your caregiver may decide to recommend you to a headache specialist, depending upon  your symptoms or other physical conditions. Having diabetes, allergies, etc., may require a more comprehensive and inclusive approach to your headache. The National Headache Foundation will provide, upon request, a list of NHF physician members in your state. Document Released: 06/18/2003 Document Revised: 03/17/2011 Document Reviewed: 11/26/2007 ExitCare Patient Information 2012 [employer].   RESOURCE GUIDE  Dental Problems  Patients with Medicaid: [facility]                     [address]                                           Phone:  [phone]                                                  If unable to pay or uninsured, contact:  [facility] or [facility] to become qualified for the adult dental clinic.  Chronic Pain Problems Contact [facility]  [phone] Patients need to be referred by their primary care doctor.  Insufficient Money for Medicine Contact [employer]:  call "211" or [facility]   271-5999.  No Primary Care Doctor Call [facility]  832-8000 Other agencies that provide inexpensive medical care    [facility]  832-8035    [facility]  832-7272    [facility]  271-5999    [facility]  832-4777    [facility]  [phone]    [facility]  272-1050  Substance Abuse Resources [facility]  336-882-2125 [facility] 336-784-9470 [facility] 336-285-9073 [facility] 336-845-3988 Residential & Outpatient Substance Abuse Program  800-659-3381  Psychological Services [facility]  832-9600 [facility]  378-7881 [facility]   800 853-5163 (emergency services 641-4993)  Abuse/Neglect [place] Child Abuse Hotline (336) 641-3795 [place] Child Abuse Hotline 800-378-5315 (After Hours)  Emergency Shelter [facility] (336)  271-5985  [facility] (336) 275-9566 [facility] (704) 372-4663  MRSA Hotline #:   832-7006    [place] Resources  [facility] [place]  [employer]                           [facility] [address] [place]                     [address]         [address]  [place]                                               [place]                              [place] Phone:  [phone]                                  Phone:  [phone]                   Phone:  [phone]  [facility] Phone:  342-8316  [place] Child Abuse Hotline (336) 342-1394 (336) 342-3537 (After Hours) 

## 2011-06-26 NOTE — ED Notes (Signed)
Pt. Stated, I've had a migraine for 3 days and my vision is blurred

## 2011-06-26 NOTE — ED Provider Notes (Signed)
Medical screening examination/treatment/procedure(s) were performed by non-physician practitioner and as supervising physician I was immediately available for consultation/collaboration.  Allea Kassner R. Velia Pamer, MD 06/26/11 1501 

## 2011-07-15 ENCOUNTER — Encounter (HOSPITAL_COMMUNITY): Payer: Self-pay

## 2011-07-15 ENCOUNTER — Emergency Department (INDEPENDENT_AMBULATORY_CARE_PROVIDER_SITE_OTHER): Payer: Self-pay

## 2011-07-15 ENCOUNTER — Emergency Department (INDEPENDENT_AMBULATORY_CARE_PROVIDER_SITE_OTHER)
Admission: EM | Admit: 2011-07-15 | Discharge: 2011-07-15 | Disposition: A | Discharge status: Home Health | Payer: Self-pay | Source: Home / Self Care | Attending: Family Medicine | Admitting: Family Medicine

## 2011-07-15 DIAGNOSIS — S63501A Unspecified sprain of right wrist, initial encounter: Secondary | ICD-10-CM

## 2011-07-15 DIAGNOSIS — S63509A Unspecified sprain of unspecified wrist, initial encounter: Secondary | ICD-10-CM

## 2011-07-15 MED ORDER — HYDROCODONE-ACETAMINOPHEN 5-325 MG PO TABS: ORAL_TABLET | ORAL | Status: DC

## 2011-07-15 NOTE — Discharge Instructions (Signed)
Your xray was negative for any fracture. This is likely a sprain, and exacerbation of your carpal tunnel syndrome. However there is one bone that, if fractured, does not show up on early x-rays. Therefore if your pain persists over the next 2 weeks, please followup here or with the orthopedic provider listed on your discharge papers. Take the pain medication as directed and only as needed. Continue to wear your wrist brace as directed.

## 2011-07-15 NOTE — ED Provider Notes (Signed)
History     CSN: [id number]  Arrival date & time 07/15/11  0940   First MD Initiated Contact with Patient 07/15/11 1009      Chief Complaint  Patient presents with  . Arm Pain    (Consider location/radiation/quality/duration/timing/severity/associated sxs/prior treatment) HPI Comments: Kristy Little presents for evaluation of pain in her right wrist and thumb after falling on her outstretched hand yesterday. She reports that she was walking down a healed towards her house when she slipped in the rain and fell on her outstretched hand. She reports pain in this wrist already from her previous diagnosis of carpal tunnel syndrome one month prior. She has been wearing a brace on this wrist intermittently. She denies any numbness tingling or weakness. She does report increased pain in the wrist, specifically at the [facility] joint of the thumb.  Patient is a 21 y.o. female presenting with wrist pain. The history is provided by the patient.  Wrist Pain This is a new problem. The current episode started yesterday. The problem occurs constantly. The problem has not changed since onset.The symptoms are aggravated by bending and exertion. The symptoms are relieved by nothing. Treatments tried: wrist brace.    Past Medical History  Diagnosis Date  . No pertinent past medical history   . Migraine     History reviewed. No pertinent past surgical history.  Family History  Problem Relation Age of Onset  . Stroke Mother   . Hypertension Father   . Stroke Father   . Diabetes Father     History  Substance Use Topics  . Smoking status: Never Smoker   . Smokeless tobacco: Never Used  . Alcohol Use: No    OB History    Grav Para Term Preterm Abortions TAB SAB Ect Mult Living   1    1 1           Review of Systems  Constitutional: Negative.   HENT: Negative.   Eyes: Negative.   Respiratory: Negative.   Cardiovascular: Negative.   Gastrointestinal: Negative.   Genitourinary: Negative.     Musculoskeletal:       RIGHT wrist injury and pain  Skin: Negative.   Neurological: Negative.     Allergies  Review of patient's allergies indicates no known allergies.  Home Medications   Current Outpatient Rx  Name Route Sig Dispense Refill  . ACETAMINOPHEN 500 MG PO TABS Oral Take 1,000 mg by mouth every 6 (six) hours as needed. For pain    . HYDROCODONE-ACETAMINOPHEN 5-325 MG PO TABS  Take one to two tablets every 4 to 6 hours as needed for pain 20 tablet 0    BP 121/66  Pulse 60  Temp(Src) 98.4 F (36.9 C) (Oral)  Resp 12  SpO2 100%  LMP 06/26/2011  Physical Exam  Nursing note and vitals reviewed. Constitutional: She is oriented to person, place, and time. She appears well-developed and well-nourished.  HENT:  Head: Normocephalic and atraumatic.  Eyes: EOM are normal.  Neck: Normal range of motion.  Pulmonary/Chest: Effort normal.  Musculoskeletal:       Right wrist: She exhibits decreased range of motion, tenderness, bony tenderness and swelling. She exhibits no deformity and no laceration.       Arms: Neurological: She is alert and oriented to person, place, and time.  Skin: Skin is warm and dry.  Psychiatric: Her behavior is normal.    ED Course  Procedures (including critical care time)  Labs Reviewed - No data to display Dg  Wrist Complete Right  07/15/2011  *RADIOLOGY REPORT*  Clinical Data: Post fall yesterday, now with pain at radial side of wrist, limited range of motion  RIGHT WRIST - COMPLETE 3+ VIEW  Comparison: Right thumb radiographs - earlier same day; Right hand radiographs - 01/03/2007  Findings: No fracture or dislocation.  There is no definite displacement of the pronator quadratus fat pad.  Joint spaces are preserved.  Regional soft tissues are normal.  IMPRESSION: No fracture or dislocation.  If the patient has pain attributable to the anatomic snuff box, immobilization and repeat radiographs in 7-10 days is recommended to evaluate for occult  scaphoid fracture.  Original Report Authenticated By: Waynard Reeds, M.D.   Dg Finger Thumb Right  07/15/2011  *RADIOLOGY REPORT*  Clinical Data: Post fall, now with right thumb pain.  RIGHT THUMB 2+V  Comparison: Right wrist radiographs - earlier same day; right hand radiographs - 01/03/2007  Findings: No fracture or dislocation.  Joint spaces are preserved. Regional soft tissues are normal.  IMPRESSION: Negative examination of the thumb.  Original Report Authenticated By: Waynard Reeds, M.D.     1. Right wrist sprain       MDM  Xray reviewed by radiologist and myself; no fracture; continue wrist brace (already had secondary to carpal tunnel syndrome dx); PRN hydrocodone; follow up with [facility] or Orthopaedics in 2 weeks if no improvement to evaluate scaphoid        Renaee Munda, MD 07/15/11 1147

## 2011-07-15 NOTE — ED Notes (Signed)
Pt c/o R arm pain following fall yesterday.  Pt slipped in mud and fell from standing position.

## 2011-07-20 ENCOUNTER — Emergency Department (HOSPITAL_COMMUNITY): Payer: Self-pay

## 2011-07-20 ENCOUNTER — Emergency Department (HOSPITAL_COMMUNITY)
Admission: EM | Admit: 2011-07-20 | Discharge: 2011-07-20 | Disposition: A | Discharge status: Home / Self Care | Payer: Self-pay | Attending: Emergency Medicine | Admitting: Emergency Medicine

## 2011-07-20 ENCOUNTER — Encounter (HOSPITAL_COMMUNITY): Payer: Self-pay | Admitting: *Deleted

## 2011-07-20 DIAGNOSIS — R509 Fever, unspecified: Secondary | ICD-10-CM | POA: Insufficient documentation

## 2011-07-20 DIAGNOSIS — R109 Unspecified abdominal pain: Secondary | ICD-10-CM | POA: Insufficient documentation

## 2011-07-20 DIAGNOSIS — B9789 Other viral agents as the cause of diseases classified elsewhere: Secondary | ICD-10-CM | POA: Insufficient documentation

## 2011-07-20 DIAGNOSIS — R070 Pain in throat: Secondary | ICD-10-CM | POA: Insufficient documentation

## 2011-07-20 DIAGNOSIS — R112 Nausea with vomiting, unspecified: Secondary | ICD-10-CM | POA: Insufficient documentation

## 2011-07-20 DIAGNOSIS — R05 Cough: Secondary | ICD-10-CM | POA: Insufficient documentation

## 2011-07-20 DIAGNOSIS — R51 Headache: Secondary | ICD-10-CM | POA: Insufficient documentation

## 2011-07-20 DIAGNOSIS — B349 Viral infection, unspecified: Secondary | ICD-10-CM

## 2011-07-20 DIAGNOSIS — J3489 Other specified disorders of nose and nasal sinuses: Secondary | ICD-10-CM | POA: Insufficient documentation

## 2011-07-20 DIAGNOSIS — R10819 Abdominal tenderness, unspecified site: Secondary | ICD-10-CM | POA: Insufficient documentation

## 2011-07-20 DIAGNOSIS — J45909 Unspecified asthma, uncomplicated: Secondary | ICD-10-CM | POA: Insufficient documentation

## 2011-07-20 DIAGNOSIS — IMO0001 Reserved for inherently not codable concepts without codable children: Secondary | ICD-10-CM | POA: Insufficient documentation

## 2011-07-20 DIAGNOSIS — R059 Cough, unspecified: Secondary | ICD-10-CM | POA: Insufficient documentation

## 2011-07-20 DIAGNOSIS — R000 Tachycardia, unspecified: Secondary | ICD-10-CM | POA: Insufficient documentation

## 2011-07-20 LAB — URINALYSIS, ROUTINE W REFLEX MICROSCOPIC
Glucose, UA: NEGATIVE mg/dL
Leukocytes, UA: NEGATIVE
Protein, ur: NEGATIVE mg/dL
Specific Gravity, Urine: 1.026 (ref 1.005–1.030)
pH: 7 (ref 5.0–8.0)

## 2011-07-20 LAB — POCT I-STAT, CHEM 8
BUN: 15 mg/dL (ref 6–23)
Chloride: 103 mEq/L (ref 96–112)
Potassium: 3.6 mEq/L (ref 3.5–5.1)
Sodium: 138 mEq/L (ref 135–145)
TCO2: 23 mmol/L (ref 0–100)

## 2011-07-20 LAB — URINE MICROSCOPIC-ADD ON

## 2011-07-20 MED ORDER — ALBUTEROL SULFATE HFA 108 (90 BASE) MCG/ACT IN AERS
INHALATION_SPRAY | RESPIRATORY_TRACT | Status: AC
  Filled 2011-07-20: qty 6.7

## 2011-07-20 MED ORDER — ACETAMINOPHEN 325 MG PO TABS
ORAL_TABLET | ORAL | Status: AC
  Filled 2011-07-20: qty 2

## 2011-07-20 MED ORDER — ALBUTEROL SULFATE HFA 108 (90 BASE) MCG/ACT IN AERS: 2.0000 | INHALATION_SPRAY | RESPIRATORY_TRACT | Status: DC | PRN

## 2011-07-20 MED ORDER — MORPHINE SULFATE 4 MG/ML IJ SOLN
4.0000 mg | Freq: Once | INTRAMUSCULAR | Status: AC
  Administered 2011-07-20: 4 mg via INTRAVENOUS
  Filled 2011-07-20: qty 1

## 2011-07-20 MED ORDER — KETOROLAC TROMETHAMINE 30 MG/ML IJ SOLN
30.0000 mg | Freq: Once | INTRAMUSCULAR | Status: AC
  Administered 2011-07-20: 30 mg via INTRAVENOUS
  Filled 2011-07-20: qty 1

## 2011-07-20 MED ORDER — ALBUTEROL SULFATE HFA 108 (90 BASE) MCG/ACT IN AERS
2.0000 | INHALATION_SPRAY | Freq: Once | RESPIRATORY_TRACT | Status: AC
  Administered 2011-07-20: 2 via RESPIRATORY_TRACT

## 2011-07-20 MED ORDER — SODIUM CHLORIDE 0.9 % IV BOLUS (SEPSIS)
1000.0000 mL | Freq: Once | INTRAVENOUS | Status: AC
  Administered 2011-07-20: 1000 mL via INTRAVENOUS

## 2011-07-20 MED ORDER — ACETAMINOPHEN 325 MG PO TABS
650.0000 mg | ORAL_TABLET | Freq: Once | ORAL | Status: AC
  Administered 2011-07-20: 650 mg via ORAL

## 2011-07-20 MED ORDER — ONDANSETRON HCL 4 MG/2ML IJ SOLN
4.0000 mg | Freq: Once | INTRAMUSCULAR | Status: AC
  Administered 2011-07-20: 4 mg via INTRAVENOUS
  Filled 2011-07-20: qty 2

## 2011-07-20 NOTE — ED Notes (Signed)
PT reports he has a HX of Asthma and is out of meds . Pt also has had general aches and pain .

## 2011-07-20 NOTE — ED Notes (Signed)
Pt c/o nausea, abdominal pain for the past 3 months when she is on her menstrual cycle.  States she had a new IUD placed 3 months ago and believes sx are related to this.  Also has low grade fever. No vomiting, pt states no appetite and lower abdominal pain that increases with palpation.

## 2011-07-20 NOTE — ED Provider Notes (Signed)
History     CSN: [id number]  Arrival date & time 07/20/11  1614   First MD Initiated Contact with Patient 07/20/11 1923      Chief Complaint  Patient presents with  . Asthma    general ACHes and pains    (Consider location/radiation/quality/duration/timing/severity/associated sxs/prior treatment) Patient is a 21 y.o. female presenting with flu symptoms. The history is provided by the patient.  Influenza This is a new problem. The current episode started 2 days ago. The problem occurs constantly. The problem has been gradually worsening. Associated symptoms include abdominal pain and headaches. Pertinent negatives include no shortness of breath. Associated symptoms comments: Myalgias, cough, sore throat, headaches, fever. Exacerbated by: Activity. The symptoms are relieved by nothing. She has tried nothing for the symptoms. The treatment provided no relief.    Past Medical History  Diagnosis Date  . No pertinent past medical history   . Migraine     History reviewed. No pertinent past surgical history.  Family History  Problem Relation Age of Onset  . Stroke Mother   . Hypertension Father   . Stroke Father   . Diabetes Father     History  Substance Use Topics  . Smoking status: Never Smoker   . Smokeless tobacco: Never Used  . Alcohol Use: No    OB History    Grav Para Term Preterm Abortions TAB SAB Ect Mult Living   1    1 1           Review of Systems  Constitutional: Positive for fever and chills.  HENT: Positive for congestion and sore throat. Negative for voice change.   Respiratory: Positive for cough. Negative for shortness of breath.   Gastrointestinal: Positive for nausea, vomiting and abdominal pain.  Genitourinary: Positive for vaginal bleeding. Negative for dysuria and vaginal discharge.  Musculoskeletal: Positive for myalgias.  Neurological: Positive for headaches.  All other systems reviewed and are negative.    Allergies  Review of patient's  allergies indicates no known allergies.  Home Medications   Current Outpatient Rx  Name Route Sig Dispense Refill  . HYDROCODONE-ACETAMINOPHEN 5-325 MG PO TABS Oral Take 1-2 tablets by mouth every 4 (four) hours as needed. for pain      BP 113/66  Pulse 99  Temp(Src) 98.7 F (37.1 C) (Oral)  Resp 18  SpO2 100%  LMP 06/26/2011  Physical Exam  Nursing note and vitals reviewed. Constitutional: She is oriented to person, place, and time. She appears well-developed and well-nourished. No distress.  HENT:  Head: Normocephalic and atraumatic.  Right Ear: Tympanic membrane and ear canal normal.  Left Ear: Tympanic membrane and ear canal normal.  Mouth/Throat: Posterior oropharyngeal erythema present. No oropharyngeal exudate or posterior oropharyngeal edema.  Eyes: EOM are normal. Pupils are equal, round, and reactive to light.  Neck: Neck supple.  Cardiovascular: Regular rhythm, normal heart sounds and intact distal pulses.  Tachycardia present.  Exam reveals no friction rub.   No murmur heard. Pulmonary/Chest: Effort normal and breath sounds normal. She has no wheezes. She has no rales.  Abdominal: Soft. Bowel sounds are normal. She exhibits no distension. There is tenderness in the suprapubic area. There is no rebound and no guarding.       Mild suprapubic tenderness  Musculoskeletal: Normal range of motion. She exhibits no tenderness.       No edema  Lymphadenopathy:    She has no cervical adenopathy.  Neurological: She is alert and oriented to person, place, and  time. No cranial nerve deficit.  Skin: Skin is warm and dry. No rash noted.  Psychiatric: She has a normal mood and affect. Her behavior is normal.    ED Course  Procedures (including critical care time)  Labs Reviewed  URINALYSIS, ROUTINE W REFLEX MICROSCOPIC - Abnormal; Notable for the following:    Hgb urine dipstick MODERATE (*)    Ketones, ur 40 (*)    All other components within normal limits  URINE  MICROSCOPIC-ADD ON - Abnormal; Notable for the following:    Squamous Epithelial / LPF FEW (*)    Bacteria, UA FEW (*)    All other components within normal limits  POCT I-STAT, CHEM 8   Dg Chest 2 View  07/20/2011  *RADIOLOGY REPORT*  Clinical Data: Mid chest pain, fever and cough; lower abdominal pain, nausea and bilateral leg pain.  CHEST - 2 VIEW  Comparison: Chest radiograph performed 07/27/2010  Findings: The lungs are well-aerated and clear.  There is no evidence of focal opacification, pleural effusion or pneumothorax.  The heart is normal in size; the mediastinal contour is within normal limits.  No acute osseous abnormalities are seen.  IMPRESSION: No acute cardiopulmonary process seen.  Original Report Authenticated By: Tonia Ghent, M.D.     No diagnosis found.    MDM   Pt with flulike symptoms.  Normal exam here but is febrile.  No signs of breathing difficulty  No signs of strep pharyngitis, otitis or abnormal abdominal findings.  Patient also started her menses 2 days ago but do not feel that this is related. She denies any dysuria, vaginal discharge or change in sexual partners. CXR wnl and i-STAT within normal limits. UA showing mild dehydration and moderate hemoglobin however patient is currently on her menses and this was a clean catch..  Will continue antipyretica and rest and fluids and return for any further problems.         Gwyneth Sprout, MD 07/20/11 2205

## 2011-07-20 NOTE — ED Notes (Signed)
No rx given, pt voiced understanding to return for worsening of sx and to return to work after being fever-free for 24 hours.

## 2011-07-21 ENCOUNTER — Telehealth: Payer: Self-pay

## 2011-07-22 ENCOUNTER — Telehealth (HOSPITAL_COMMUNITY): Payer: Self-pay | Admitting: *Deleted

## 2011-07-22 NOTE — ED Notes (Signed)
4/11 Pt. Called on VM @ 1114 and said she lost her Rx. 1600 I called pt. back and she said she did not fill it because she did not have the money and when she got paid she could not find the Rx. I told her it would have been stapled to her d/c instructions.  She said she had removed it. I asked what the medicine was and she said Hydrocodone 5-325 mg. I told her we don't usually refill lost or stolen rx.'s of narcotics, but I would ask Dr. Juanetta Gosling tomorrow when he was here.  4/12 Discussed with Dr. Juanetta Gosling and he checked the registry and it has not been filled. He approved for # 10 to be called in. I called pt. back and she wants it called to the [facility] on [address]. Rx. called to the pharmacist with same sig only #10 @ [phone]. Vassie Moselle 07/22/2011

## 2012-03-04 ENCOUNTER — Encounter (HOSPITAL_COMMUNITY): Payer: Self-pay | Admitting: *Deleted

## 2012-03-04 ENCOUNTER — Emergency Department (HOSPITAL_COMMUNITY)
Admission: EM | Admit: 2012-03-04 | Discharge: 2012-03-04 | Disposition: A | Discharge status: Home / Self Care | Payer: Self-pay | Attending: Emergency Medicine | Admitting: Emergency Medicine

## 2012-03-04 DIAGNOSIS — R51 Headache: Secondary | ICD-10-CM | POA: Insufficient documentation

## 2012-03-04 DIAGNOSIS — G43909 Migraine, unspecified, not intractable, without status migrainosus: Secondary | ICD-10-CM | POA: Insufficient documentation

## 2012-03-04 DIAGNOSIS — M791 Myalgia, unspecified site: Secondary | ICD-10-CM

## 2012-03-04 DIAGNOSIS — Z79899 Other long term (current) drug therapy: Secondary | ICD-10-CM | POA: Insufficient documentation

## 2012-03-04 DIAGNOSIS — IMO0001 Reserved for inherently not codable concepts without codable children: Secondary | ICD-10-CM | POA: Insufficient documentation

## 2012-03-04 DIAGNOSIS — J020 Streptococcal pharyngitis: Secondary | ICD-10-CM | POA: Insufficient documentation

## 2012-03-04 DIAGNOSIS — M79609 Pain in unspecified limb: Secondary | ICD-10-CM | POA: Insufficient documentation

## 2012-03-04 DIAGNOSIS — R509 Fever, unspecified: Secondary | ICD-10-CM | POA: Insufficient documentation

## 2012-03-04 MED ORDER — IBUPROFEN 400 MG PO TABS
ORAL_TABLET | ORAL | Status: AC
  Administered 2012-03-04: 600 mg via ORAL
  Filled 2012-03-04: qty 2

## 2012-03-04 MED ORDER — PENICILLIN V POTASSIUM 500 MG PO TABS: 500.0000 mg | ORAL_TABLET | Freq: Three times a day (TID) | ORAL | Status: DC

## 2012-03-04 MED ORDER — KETOROLAC TROMETHAMINE 60 MG/2ML IM SOLN
60.0000 mg | Freq: Once | INTRAMUSCULAR | Status: AC
  Administered 2012-03-04: 60 mg via INTRAMUSCULAR
  Filled 2012-03-04: qty 2

## 2012-03-04 MED ORDER — IBUPROFEN 400 MG PO TABS
600.0000 mg | ORAL_TABLET | Freq: Once | ORAL | Status: AC
  Administered 2012-03-04: 600 mg via ORAL

## 2012-03-04 MED ORDER — IBUPROFEN 600 MG PO TABS: 600.0000 mg | ORAL_TABLET | Freq: Four times a day (QID) | ORAL | Status: DC | PRN

## 2012-03-04 NOTE — ED Notes (Signed)
Pt able to ambulate from department independently.

## 2012-03-04 NOTE — ED Notes (Signed)
Reports sore throat, difficulty swallowing, bil leg pain, fever, and headache that started this morning.

## 2012-03-05 ENCOUNTER — Encounter (HOSPITAL_COMMUNITY): Payer: Self-pay | Admitting: Emergency Medicine

## 2012-03-05 ENCOUNTER — Emergency Department (HOSPITAL_COMMUNITY)
Admission: EM | Admit: 2012-03-05 | Discharge: 2012-03-05 | Disposition: A | Discharge status: Home / Self Care | Payer: Self-pay | Attending: Emergency Medicine | Admitting: Emergency Medicine

## 2012-03-05 DIAGNOSIS — J020 Streptococcal pharyngitis: Secondary | ICD-10-CM | POA: Insufficient documentation

## 2012-03-05 DIAGNOSIS — Z79899 Other long term (current) drug therapy: Secondary | ICD-10-CM | POA: Insufficient documentation

## 2012-03-05 DIAGNOSIS — R112 Nausea with vomiting, unspecified: Secondary | ICD-10-CM | POA: Insufficient documentation

## 2012-03-05 DIAGNOSIS — G43909 Migraine, unspecified, not intractable, without status migrainosus: Secondary | ICD-10-CM | POA: Insufficient documentation

## 2012-03-05 MED ORDER — ONDANSETRON HCL 4 MG/2ML IJ SOLN
4.0000 mg | Freq: Once | INTRAMUSCULAR | Status: AC
  Administered 2012-03-05: 4 mg via INTRAVENOUS
  Filled 2012-03-05: qty 2

## 2012-03-05 MED ORDER — IBUPROFEN 400 MG PO TABS: 400.0000 mg | ORAL_TABLET | ORAL | Status: DC | PRN

## 2012-03-05 MED ORDER — SODIUM CHLORIDE 0.9 % IV BOLUS (SEPSIS): 1000.0000 mL | Freq: Once | INTRAVENOUS | Status: DC

## 2012-03-05 MED ORDER — HYDROCODONE-ACETAMINOPHEN 5-325 MG PO TABS: 1.0000 | ORAL_TABLET | Freq: Four times a day (QID) | ORAL | Status: DC | PRN

## 2012-03-05 MED ORDER — HYDROMORPHONE HCL PF 1 MG/ML IJ SOLN
0.5000 mg | Freq: Once | INTRAMUSCULAR | Status: AC
  Administered 2012-03-05: 0.5 mg via INTRAVENOUS
  Filled 2012-03-05: qty 1

## 2012-03-05 MED ORDER — CLINDAMYCIN HCL 300 MG PO CAPS: 300.0000 mg | ORAL_CAPSULE | Freq: Four times a day (QID) | ORAL | Status: DC

## 2012-03-05 MED ORDER — KETOROLAC TROMETHAMINE 30 MG/ML IJ SOLN
15.0000 mg | Freq: Once | INTRAMUSCULAR | Status: AC
  Administered 2012-03-05: 15 mg via INTRAVENOUS
  Filled 2012-03-05: qty 1

## 2012-03-05 MED ORDER — SODIUM CHLORIDE 0.9 % IV SOLN
1000.0000 mL | Freq: Once | INTRAVENOUS | Status: AC
  Administered 2012-03-05: 1000 mL via INTRAVENOUS

## 2012-03-05 MED ORDER — ONDANSETRON 4 MG PO TBDP: 4.0000 mg | ORAL_TABLET | Freq: Four times a day (QID) | ORAL | Status: DC | PRN

## 2012-03-05 MED ORDER — LACTATED RINGERS IV BOLUS (SEPSIS)
1000.0000 mL | Freq: Once | INTRAVENOUS | Status: AC
  Administered 2012-03-05: 1000 mL via INTRAVENOUS

## 2012-03-05 NOTE — ED Provider Notes (Signed)
History     CSN: [id number]  Arrival date & time 03/04/12  1234   First MD Initiated Contact with Patient 03/04/12 1432      Chief Complaint  Patient presents with  . Sore Throat  . Leg Pain  . Headache    (Consider location/radiation/quality/duration/timing/severity/associated sxs/prior treatment) HPI Comments: Kristy Little presents with sore throat and difficulty swallowing secondary to pain, fever to 101., headache and generalized myalgias,  Particularly in her bilateral legs since she woke this morning.  She denies nausea, vomiting, shortness of breath, cough and congestion.  She has a positive strep exposure in a family member this week.  She has taken ibuprofen prior to arrival with minimal improvement in symptoms.  She denies muscle spasms, rash, dysuria or decreased urinary frequency.    Patient is a 21 y.o. female presenting with leg pain and headaches. The history is provided by the patient.  Leg Pain  Pertinent negatives include no numbness.  Headache  Associated symptoms include a fever. Pertinent negatives include no shortness of breath, no nausea and no vomiting.    Past Medical History  Diagnosis Date  . No pertinent past medical history   . Migraine     History reviewed. No pertinent past surgical history.  Family History  Problem Relation Age of Onset  . Stroke Mother   . Hypertension Father   . Stroke Father   . Diabetes Father     History  Substance Use Topics  . Smoking status: Never Smoker   . Smokeless tobacco: Never Used  . Alcohol Use: No    OB History    Grav Para Term Preterm Abortions TAB SAB Ect Mult Living   1    1 1           Review of Systems  Constitutional: Positive for fever.  HENT: Positive for sore throat and trouble swallowing. Negative for congestion, rhinorrhea, neck pain, neck stiffness and voice change.   Eyes: Negative.   Respiratory: Negative for cough, chest tightness and shortness of breath.     Cardiovascular: Negative for chest pain.  Gastrointestinal: Negative for nausea, vomiting and abdominal pain.  Genitourinary: Negative.  Negative for dysuria.  Musculoskeletal: Negative for joint swelling and arthralgias.  Skin: Negative.  Negative for rash and wound.  Neurological: Negative for dizziness, weakness, light-headedness, numbness and headaches.  Hematological: Negative.   Psychiatric/Behavioral: Negative.     Allergies  Latex  Home Medications   Current Outpatient Rx  Name  Route  Sig  Dispense  Refill  . CLINDAMYCIN HCL 300 MG PO CAPS   Oral   Take 1 capsule (300 mg total) by mouth every 6 (six) hours.   28 capsule   0   . ETONOGESTREL 68 MG [place] IMPL   Subcutaneous   Inject 1 each into the skin once.         Marland Kitchen HYDROCODONE-ACETAMINOPHEN 5-325 MG PO TABS   Oral   Take 1-2 tablets by mouth every 6 (six) hours as needed for pain.   19 tablet   0   . IBUPROFEN 400 MG PO TABS   Oral   Take 1 tablet (400 mg total) by mouth every 4 (four) hours as needed for pain.   30 tablet   0   . ONDANSETRON 4 MG PO TBDP   Oral   Take 1 tablet (4 mg total) by mouth every 6 (six) hours as needed for nausea.   10 tablet   0  BP 120/83  Pulse 111  Temp 100.7 F (38.2 C) (Oral)  Resp 24  Ht 5\' 4"  (1.626 m)  Wt 121 lb (54.885 kg)  BMI 20.77 kg/m2  SpO2 100%  LMP 02/26/2012  Physical Exam  Constitutional: She is oriented to person, place, and time. She appears well-developed and well-nourished.  HENT:  Head: Normocephalic and atraumatic.  Right Ear: Tympanic membrane and ear canal normal.  Left Ear: Tympanic membrane and ear canal normal.  Nose: Nose normal.  Mouth/Throat: Uvula is midline and mucous membranes are normal. Oropharyngeal exudate and posterior oropharyngeal erythema present. No posterior oropharyngeal edema or tonsillar abscesses.       Tonsils symmetric,  Uvula midline.  Eyes: Conjunctivae normal are normal.  Cardiovascular: Normal rate and  normal heart sounds.   Pulmonary/Chest: Effort normal. No respiratory distress. She has no wheezes. She has no rales.  Abdominal: Soft. There is no tenderness.  Musculoskeletal: Normal range of motion.  Neurological: She is alert and oriented to person, place, and time.  Skin: Skin is warm and dry. No rash noted.  Psychiatric: She has a normal mood and affect.    ED Course  Procedures (including critical care time)   Labs Reviewed  RAPID STREP SCREEN  LAB REPORT - SCANNED   No results found.   1. Strep pharyngitis   2. Myalgia       MDM  Labs reviewed - rapid strep negative,  Yet with exam findings and known exposure to strep,  Will treat with penicillin, also prescribed ibuprofen for throat pain, fever and myalgias.  Encouraged rest, fluids.  Recheck by pcp if not improved over the next week,  Sooner for worsened sx.        Burgess Amor, [place] 03/05/12 [phone]

## 2012-03-05 NOTE — ED Notes (Signed)
Pt diagnosed with strep yesterday and was given antibiotic shot. Pt states after leaving ED yesterday has been throwing up since. Motrin is not helping pain either per patient.

## 2012-03-05 NOTE — ED Notes (Signed)
Pt c/o NV and sore throat. Pt states she was seen here yesterday for sore throat and "leg burning" and tx for strep throat. Pt states she began vomiting after leaving and has not been able to keep meds/fluids/foods down. Pt denies abdominal pain, diarrhea, and blood in vomit.

## 2012-03-05 NOTE — ED Notes (Signed)
Last tylenol at 8am today

## 2012-03-05 NOTE — ED Provider Notes (Signed)
History  This chart was scribed for Kristy Skene, MD by Kristy Little, ED Scribe. The patient was seen in room APA10/APA10. Patient's care was started at 1051.  CSN: [id number]  Arrival date & time 03/05/12  1051   First MD Initiated Contact with Patient 03/05/12 1113      Chief Complaint  Patient presents with  . Emesis     The history is provided by the patient. No language interpreter was used.    HPI Comments: Kristy Little is a 21 y.o. female who presents to the Emergency Department complaining of continuous, non-bloody emesis onset yesterday and severe sore throat onset a few days ago. Patient says that she was diagnosed with strep throat here yesterday. Per medical records, patient was given Ibuprofen 600 mg and Toradol 60 mg. She was prescribed penicillin 500 mg to take 3 times daily and Ibuprofen 600 mg to take at home. Patient also complains of bilateral, aching, leg pain. She rates the leg pain as 9/10. Other associated symptoms include nausea, fever and difficulty swallowing secondary to pain. Patient denies rash, cough, chest pain, or SOB.  Past Medical History  Diagnosis Date  . No pertinent past medical history   . Migraine     History reviewed. No pertinent past surgical history.  Family History  Problem Relation Age of Onset  . Stroke Mother   . Hypertension Father   . Stroke Father   . Diabetes Father     History  Substance Use Topics  . Smoking status: Never Smoker   . Smokeless tobacco: Never Used  . Alcohol Use: No    OB History    Grav Para Term Preterm Abortions TAB SAB Ect Mult Living   1    1 1           Review of Systems At least 10pt or greater review of systems completed and are negative except where specified in the HPI.  Allergies  Review of patient's allergies indicates no known allergies.  Home Medications   Current Outpatient Rx  Name  Route  Sig  Dispense  Refill  . IBUPROFEN 600 MG PO TABS   Oral   Take 1 tablet (600  mg total) by mouth every 6 (six) hours as needed for pain.   30 tablet   0   . PENICILLIN V POTASSIUM 500 MG PO TABS   Oral   Take 1 tablet (500 mg total) by mouth 3 (three) times daily.   30 tablet   0     Triage Vitals: BP 116/69  Pulse 118  Temp 102.9 F (39.4 C) (Oral)  Resp 20  Ht 5\' 4"  (1.626 m)  Wt 121 lb (54.885 kg)  BMI 20.77 kg/m2  SpO2 99%  LMP 02/26/2012  Physical Exam Nursing notes reviewed.  Electronic medical record reviewed. VITAL SIGNS:   Filed Vitals:   03/05/12 1057  BP: 116/69  Pulse: 118  Temp: 102.9 F (39.4 C)  TempSrc: Oral  Resp: 20  Height: 5\' 4"  (1.626 m)  Weight: 121 lb (54.885 kg)  SpO2: 99%   CONSTITUTIONAL: Awake, oriented, appears non-toxic HENT: Atraumatic, normocephalic, oral mucosa pink and moist, airway patent.  Tonsils 3+, yellow/white exudates coat tonsils hich are erythematous and swollen but symmetric.  Voice is normal. Nares patent without drainage. External ears normal. EYES: Conjunctiva clear, EOMI, PERRLA NECK: Trachea midline, non-tender, supple CARDIOVASCULAR: Normal heart rate, Normal rhythm, No murmurs, rubs, gallops PULMONARY/CHEST: Clear to auscultation, no rhonchi, wheezes, or rales. Symmetrical  breath sounds. Non-tender. ABDOMINAL: Non-distended, soft, non-tender - no rebound or guarding.  BS normal. NEUROLOGIC: Non-focal, moving all four extremities, no gross sensory or motor deficits. EXTREMITIES: No clubbing, cyanosis, or edema SKIN: Warm, Dry, No erythema, No rash  ED Course  Procedures (including critical care time) DIAGNOSTIC STUDIES: Oxygen Saturation is 99% on room air, normal by my interpretation.    COORDINATION OF CARE: 11:39 AM- Patient informed of current plan for treatment and evaluation and agrees with plan at this time.   Results for orders placed during the hospital encounter of 03/05/12  CK      Component Value Range   Total CK 76  7 - 177 U/L    No results found.   1. Pharyngitis,  streptococcal, acute   2. Nausea and vomiting       Medications  etonogestrel (IMPLANON) 68 MG IMPL implant (not administered)  clindamycin (CLEOCIN) 300 MG capsule (not administered)  HYDROcodone-acetaminophen (NORCO/VICODIN) 5-325 MG per tablet (not administered)  ondansetron (ZOFRAN ODT) 4 MG disintegrating tablet (not administered)  ibuprofen (ADVIL,MOTRIN) 400 MG tablet (not administered)  ondansetron (ZOFRAN) injection 4 mg (4 mg Intravenous Given 03/05/12 1120)  0.9 %  sodium chloride infusion (0 mL Intravenous Stopped 03/05/12 1221)  ketorolac (TORADOL) 30 MG/ML injection 15 mg (15 mg Intravenous Given 03/05/12 1146)  HYDROmorphone (DILAUDID) injection 0.5 mg (0.5 mg Intravenous Given 03/05/12 1147)  lactated ringers bolus 1,000 mL (1000 mL Intravenous New Bag/Given 03/05/12 1221)  HYDROmorphone (DILAUDID) injection 0.5 mg (0.5 mg Intravenous Given 03/05/12 1237)  ondansetron (ZOFRAN) injection 4 mg (4 mg Intravenous Given 03/05/12 1251)    MDM  Kristy Little is a 21 y.o. female presenting the day after being diagnosed with Strep throat with nausea, vomiting and sore throat.  Sx all commensurate with streptococcal pharyngitis.  Pt said she was not taking antibiotics.  Gave Rx for clindamycin d/t worsening symptoms.  Treated pain and nausea aggressively.  Also c/o leg pain - CK was WNL, pt has had this pain before she added in further history.  No evidence for PTA/RPA, Lemierre's syndrome.  She looks like she feels terrible, but is non toxic and is tolerated po after treatment.  She hasn't been taking her antibiotics and hasn't kept her ibuprofen down.  Zofran will encourage compliance with not vomiting.  I explained the diagnosis and have given explicit precautions to return to the ER including worsening vomiting pain or any other new or worsening symptoms. The patient understands and accepts the medical plan as it's been dictated and I have answered their questions. Discharge  instructions concerning home care and prescriptions have been given.  The patient is STABLE and is discharged to home in good condition.  I personally performed the services described in this documentation, which was scribed in my presence. The recorded information has been reviewed and is accurate. Kristy Little, M.D.      Kristy Skene, MD 03/05/12 2013

## 2012-03-07 NOTE — ED Provider Notes (Signed)
Medical screening examination/treatment/procedure(s) were performed by non-physician practitioner and as supervising physician I was immediately available for consultation/collaboration.   Benny Lennert, MD 03/07/12 1501

## 2012-03-07 NOTE — ED Provider Notes (Signed)
Patient called in stating that she was not able to afford the prescription for clindamycin. She is being treated for streptococcal infection and is not penicillin allergic, so prescription is called into her pharmacy for Pen-Vee K 500 mg #40 to take 1 tablet, 4 times a day.  Dione Booze, MD 03/07/12 [phone]

## 2012-04-01 ENCOUNTER — Encounter (HOSPITAL_COMMUNITY): Payer: Self-pay | Admitting: *Deleted

## 2012-04-01 DIAGNOSIS — M545 Low back pain, unspecified: Secondary | ICD-10-CM | POA: Insufficient documentation

## 2012-04-01 DIAGNOSIS — N390 Urinary tract infection, site not specified: Secondary | ICD-10-CM | POA: Insufficient documentation

## 2012-04-01 DIAGNOSIS — N949 Unspecified condition associated with female genital organs and menstrual cycle: Secondary | ICD-10-CM | POA: Insufficient documentation

## 2012-04-01 DIAGNOSIS — Z79899 Other long term (current) drug therapy: Secondary | ICD-10-CM | POA: Insufficient documentation

## 2012-04-01 DIAGNOSIS — Z3202 Encounter for pregnancy test, result negative: Secondary | ICD-10-CM | POA: Insufficient documentation

## 2012-04-01 DIAGNOSIS — Z8669 Personal history of other diseases of the nervous system and sense organs: Secondary | ICD-10-CM | POA: Insufficient documentation

## 2012-04-01 NOTE — ED Notes (Signed)
Pt c/o urinary frequency, dysuria, and lower back pain x2 days

## 2012-04-02 ENCOUNTER — Emergency Department (HOSPITAL_COMMUNITY)
Admission: EM | Admit: 2012-04-02 | Discharge: 2012-04-02 | Disposition: A | Discharge status: Home / Self Care | Payer: Self-pay | Attending: Emergency Medicine | Admitting: Emergency Medicine

## 2012-04-02 DIAGNOSIS — N390 Urinary tract infection, site not specified: Secondary | ICD-10-CM

## 2012-04-02 LAB — URINALYSIS, ROUTINE W REFLEX MICROSCOPIC
Ketones, ur: NEGATIVE mg/dL
Leukocytes, UA: NEGATIVE
Nitrite: NEGATIVE
pH: 5.5 (ref 5.0–8.0)

## 2012-04-02 LAB — URINE MICROSCOPIC-ADD ON

## 2012-04-02 LAB — PREGNANCY, URINE: Preg Test, Ur: NEGATIVE

## 2012-04-02 LAB — WET PREP, GENITAL: Yeast Wet Prep HPF POC: NONE SEEN

## 2012-04-02 MED ORDER — CEPHALEXIN 500 MG PO CAPS: 500.0000 mg | ORAL_CAPSULE | Freq: Four times a day (QID) | ORAL | Status: DC

## 2012-04-02 MED ORDER — CEPHALEXIN 500 MG PO CAPS
500.0000 mg | ORAL_CAPSULE | Freq: Once | ORAL | Status: AC
  Administered 2012-04-02: 500 mg via ORAL
  Filled 2012-04-02: qty 1

## 2012-04-02 NOTE — ED Provider Notes (Signed)
History     CSN: [id number]  Arrival date & time 04/01/12  2333   First MD Initiated Contact with Patient 04/02/12 0044      Chief Complaint  Patient presents with  . Dysuria  . Back Pain  . Vaginal Pain    The history is provided by the patient and medical records.   patient reports dysuria and urinary frequency with past 48 hours.  No nausea or vomiting fevers or chills.  No flank pain.  She does have mild low back pain.  She's also had vaginal irritation for one week since having intercourse with a latex condom.  She is allergic to latex.  No vaginal discharge or vaginal bleeding.  No significant abdominal pain.  No diarrhea nausea or vomiting.  Nothing worsens her symptoms.  Nothing improves her symptoms.  Symptoms are mild.  Past Medical History  Diagnosis Date  . No pertinent past medical history   . Migraine     History reviewed. No pertinent past surgical history.  Family History  Problem Relation Age of Onset  . Stroke Mother   . Hypertension Father   . Stroke Father   . Diabetes Father     History  Substance Use Topics  . Smoking status: Never Smoker   . Smokeless tobacco: Never Used  . Alcohol Use: No    OB History    Grav Para Term Preterm Abortions TAB SAB Ect Mult Living   1    1 1           Review of Systems  Genitourinary: Positive for dysuria and vaginal pain.  Musculoskeletal: Positive for back pain.  All other systems reviewed and are negative.    Allergies  Latex  Home Medications   Current Outpatient Rx  Name  Route  Sig  Dispense  Refill  . CEPHALEXIN 500 MG PO CAPS   Oral   Take 1 capsule (500 mg total) by mouth 4 (four) times daily.   20 capsule   0   . CLINDAMYCIN HCL 300 MG PO CAPS   Oral   Take 1 capsule (300 mg total) by mouth every 6 (six) hours.   28 capsule   0   . ETONOGESTREL 68 MG [place] IMPL   Subcutaneous   Inject 1 each into the skin once.         Marland Kitchen HYDROCODONE-ACETAMINOPHEN 5-325 MG PO TABS   Oral  Take 1-2 tablets by mouth every 6 (six) hours as needed for pain.   19 tablet   0   . IBUPROFEN 400 MG PO TABS   Oral   Take 1 tablet (400 mg total) by mouth every 4 (four) hours as needed for pain.   30 tablet   0   . ONDANSETRON 4 MG PO TBDP   Oral   Take 1 tablet (4 mg total) by mouth every 6 (six) hours as needed for nausea.   10 tablet   0     BP 109/73  Pulse 84  Temp 98.6 F (37 C)  Resp 20  Ht 5\' 5"  (1.651 m)  Wt 119 lb (53.978 kg)  BMI 19.80 kg/m2  SpO2 100%  LMP 02/26/2012  Physical Exam  Nursing note and vitals reviewed. Constitutional: She is oriented to person, place, and time. She appears well-developed and well-nourished. No distress.  HENT:  Head: Normocephalic and atraumatic.  Eyes: EOM are normal.  Neck: Normal range of motion.  Cardiovascular: Normal rate, regular rhythm and normal heart  sounds.   Pulmonary/Chest: Effort normal and breath sounds normal.  Abdominal: Soft. She exhibits no distension. There is no tenderness.  Genitourinary:       Normal external genitalia.  No cervical motion tenderness.  No adnexal masses or fullness.  No vaginal discharge or vaginal bleeding noted.  Musculoskeletal: Normal range of motion.  Neurological: She is alert and oriented to person, place, and time.  Skin: Skin is warm and dry.  Psychiatric: She has a normal mood and affect. Judgment normal.    ED Course  Procedures (including critical care time)  Labs Reviewed  URINALYSIS, ROUTINE W REFLEX MICROSCOPIC - Abnormal; Notable for the following:    Specific Gravity, Urine >1.030 (*)     Hgb urine dipstick MODERATE (*)     Protein, ur 30 (*)     All other components within normal limits  WET PREP, GENITAL - Abnormal; Notable for the following:    WBC, Wet Prep HPF POC RARE (*)     All other components within normal limits  URINE MICROSCOPIC-ADD ON - Abnormal; Notable for the following:    Squamous Epithelial / LPF MANY (*)     Bacteria, UA FEW (*)      All other components within normal limits  PREGNANCY, URINE  GC/CHLAMYDIA PROBE AMP  URINE CULTURE  URINE CULTURE   No results found.   1. Urinary tract infection       MDM  No signs of vaginitis.  The patient be treated for urinary tract infection.  Discharge home in good condition with instructions to followup with her OB/GYN and primary care Dr.  She understands return to the ER for new or worsening symptoms        Lyanne Co, MD 04/02/12 [phone]

## 2012-04-02 NOTE — ED Notes (Signed)
Pt reporting pain in lower back.  Reports frequency and urgency of urination.  Pt also reports using latex condoms although she does have an allergy to latex. Pt also reports that she did have unprotected sex and wants to be sure she doesn't have an std.

## 2012-04-04 LAB — URINE CULTURE

## 2012-04-09 LAB — URINE CULTURE: Colony Count: >=100000

## 2012-10-09 ENCOUNTER — Emergency Department (HOSPITAL_COMMUNITY)
Admission: EM | Admit: 2012-10-09 | Discharge: 2012-10-09 | Disposition: A | Discharge status: Home / Self Care | Payer: No Typology Code available for payment source | Attending: Emergency Medicine | Admitting: Emergency Medicine

## 2012-10-09 ENCOUNTER — Emergency Department (HOSPITAL_COMMUNITY): Payer: Self-pay

## 2012-10-09 ENCOUNTER — Encounter (HOSPITAL_COMMUNITY): Payer: Self-pay | Admitting: *Deleted

## 2012-10-09 DIAGNOSIS — Z91040 Latex allergy status: Secondary | ICD-10-CM | POA: Insufficient documentation

## 2012-10-09 DIAGNOSIS — Y9389 Activity, other specified: Secondary | ICD-10-CM | POA: Insufficient documentation

## 2012-10-09 DIAGNOSIS — Z79899 Other long term (current) drug therapy: Secondary | ICD-10-CM | POA: Insufficient documentation

## 2012-10-09 DIAGNOSIS — Z3202 Encounter for pregnancy test, result negative: Secondary | ICD-10-CM | POA: Insufficient documentation

## 2012-10-09 DIAGNOSIS — IMO0002 Reserved for concepts with insufficient information to code with codable children: Secondary | ICD-10-CM | POA: Insufficient documentation

## 2012-10-09 DIAGNOSIS — S0990XA Unspecified injury of head, initial encounter: Secondary | ICD-10-CM | POA: Insufficient documentation

## 2012-10-09 DIAGNOSIS — S0993XA Unspecified injury of face, initial encounter: Secondary | ICD-10-CM | POA: Insufficient documentation

## 2012-10-09 DIAGNOSIS — Z8679 Personal history of other diseases of the circulatory system: Secondary | ICD-10-CM | POA: Insufficient documentation

## 2012-10-09 DIAGNOSIS — Y92410 Unspecified street and highway as the place of occurrence of the external cause: Secondary | ICD-10-CM | POA: Insufficient documentation

## 2012-10-09 MED ORDER — NAPROXEN 500 MG PO TABS: 500.0000 mg | ORAL_TABLET | Freq: Two times a day (BID) | ORAL | Status: DC

## 2012-10-09 MED ORDER — ACETAMINOPHEN 500 MG PO TABS
1000.0000 mg | ORAL_TABLET | Freq: Once | ORAL | Status: DC
  Filled 2012-10-09: qty 2
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
## 2012-10-09 NOTE — ED Notes (Signed)
Pt denies back injury, wants neck brace off and just wants a tylenol and be released. Main complaint is headache.

## 2012-10-24 ENCOUNTER — Emergency Department (HOSPITAL_COMMUNITY)
Admission: EM | Admit: 2012-10-24 | Discharge: 2012-10-24 | Disposition: A | Discharge status: Home / Self Care | Payer: Self-pay | Attending: Emergency Medicine | Admitting: Emergency Medicine

## 2012-10-24 ENCOUNTER — Emergency Department (HOSPITAL_COMMUNITY): Payer: Self-pay

## 2012-10-24 ENCOUNTER — Encounter (HOSPITAL_COMMUNITY): Payer: Self-pay | Admitting: *Deleted

## 2012-10-24 DIAGNOSIS — N730 Acute parametritis and pelvic cellulitis: Secondary | ICD-10-CM | POA: Insufficient documentation

## 2012-10-24 DIAGNOSIS — Z91040 Latex allergy status: Secondary | ICD-10-CM | POA: Insufficient documentation

## 2012-10-24 DIAGNOSIS — R110 Nausea: Secondary | ICD-10-CM | POA: Insufficient documentation

## 2012-10-24 DIAGNOSIS — Z79899 Other long term (current) drug therapy: Secondary | ICD-10-CM | POA: Insufficient documentation

## 2012-10-24 DIAGNOSIS — Z8679 Personal history of other diseases of the circulatory system: Secondary | ICD-10-CM | POA: Insufficient documentation

## 2012-10-24 DIAGNOSIS — Z3202 Encounter for pregnancy test, result negative: Secondary | ICD-10-CM | POA: Insufficient documentation

## 2012-10-24 LAB — COMPREHENSIVE METABOLIC PANEL
ALT: 11 U/L (ref 0–35)
Alkaline Phosphatase: 42 U/L (ref 39–117)
BUN: 15 mg/dL (ref 6–23)
CO2: 30 mEq/L (ref 19–32)
Chloride: 104 mEq/L (ref 96–112)
GFR calc Af Amer: >90 mL/min (ref >90)
GFR calc non Af Amer: >90 mL/min (ref >90)
Glucose, Bld: 93 mg/dL (ref 70–99)
Potassium: 3.7 mEq/L (ref 3.5–5.1)
Sodium: 140 mEq/L (ref 135–145)
Total Bilirubin: 0.6 mg/dL (ref 0.3–1.2)
Total Protein: 7.9 g/dL (ref 6.0–8.3)

## 2012-10-24 LAB — CBC WITH DIFFERENTIAL/PLATELET
Eosinophils Absolute: 0.2 10*3/uL (ref 0.0–0.7)
Hemoglobin: 13.4 g/dL (ref 12.0–15.0)
Lymphocytes Relative: 44 % (ref 12–46)
Lymphs Abs: 1.7 10*3/uL (ref 0.7–4.0)
MCH: 31.5 pg (ref 26.0–34.0)
Monocytes Relative: 11 % (ref 3–12)
Neutrophils Relative %: 40 % — ABNORMAL LOW (ref 43–77)
Platelets: 214 10*3/uL (ref 150–400)
RBC: 4.26 MIL/uL (ref 3.87–5.11)
WBC: 3.9 10*3/uL — ABNORMAL LOW (ref 4.0–10.5)

## 2012-10-24 LAB — URINALYSIS, ROUTINE W REFLEX MICROSCOPIC
Glucose, UA: NEGATIVE mg/dL
Ketones, ur: NEGATIVE mg/dL
Protein, ur: NEGATIVE mg/dL

## 2012-10-24 LAB — URINE MICROSCOPIC-ADD ON

## 2012-10-24 LAB — WET PREP, GENITAL: Yeast Wet Prep HPF POC: NONE SEEN

## 2012-10-24 MED ORDER — MORPHINE SULFATE 2 MG/ML IJ SOLN
2.0000 mg | Freq: Once | INTRAMUSCULAR | Status: AC
  Administered 2012-10-24: 2 mg via INTRAVENOUS
  Filled 2012-10-24: qty 1

## 2012-10-24 MED ORDER — ONDANSETRON HCL 4 MG/2ML IJ SOLN
4.0000 mg | Freq: Once | INTRAMUSCULAR | Status: AC
  Administered 2012-10-24: 4 mg via INTRAVENOUS
  Filled 2012-10-24: qty 2

## 2012-10-24 MED ORDER — KETOROLAC TROMETHAMINE 30 MG/ML IJ SOLN
30.0000 mg | Freq: Once | INTRAMUSCULAR | Status: AC
  Administered 2012-10-24: 30 mg via INTRAVENOUS
  Filled 2012-10-24: qty 1

## 2012-10-24 MED ORDER — IOHEXOL 300 MG/ML  SOLN
100.0000 mL | Freq: Once | INTRAMUSCULAR | Status: AC | PRN
  Administered 2012-10-24: 100 mL via INTRAVENOUS

## 2012-10-24 MED ORDER — DOXYCYCLINE HYCLATE 100 MG PO CAPS: 100.0000 mg | ORAL_CAPSULE | Freq: Two times a day (BID) | ORAL | Status: DC

## 2012-10-24 MED ORDER — IOHEXOL 300 MG/ML  SOLN
50.0000 mL | INTRAMUSCULAR | Status: AC
  Administered 2012-10-24: 50 mL via ORAL

## 2012-10-24 MED ORDER — ACETAMINOPHEN 325 MG PO TABS
650.0000 mg | ORAL_TABLET | Freq: Once | ORAL | Status: AC
  Administered 2012-10-24: 650 mg via ORAL
  Filled 2012-10-24: qty 2

## 2012-10-24 MED ORDER — METRONIDAZOLE 500 MG PO TABS: 500.0000 mg | ORAL_TABLET | Freq: Two times a day (BID) | ORAL | Status: DC

## 2012-10-24 MED ORDER — NAPROXEN 500 MG PO TABS: 500.0000 mg | ORAL_TABLET | Freq: Two times a day (BID) | ORAL | Status: DC

## 2012-10-24 MED ORDER — DEXTROSE 5 % IV SOLN
1.0000 g | Freq: Once | INTRAVENOUS | Status: AC
  Administered 2012-10-24: 1 g via INTRAVENOUS
  Filled 2012-10-24: qty 10

## 2012-10-24 MED ORDER — SODIUM CHLORIDE 0.9 % IV SOLN
Freq: Once | INTRAVENOUS | Status: AC
  Administered 2012-10-24: 09:00:00 via INTRAVENOUS

## 2012-10-24 MED ORDER — MORPHINE SULFATE 2 MG/ML IJ SOLN
2.0000 mg | Freq: Once | INTRAMUSCULAR | Status: AC
Start: 2012-10-24 — End: 2012-10-24
  Administered 2012-10-24: 2 mg via INTRAVENOUS
  Filled 2012-10-24: qty 1

## 2012-10-24 MED ORDER — DEXTROSE 5 % IV SOLN: 250.0000 mg | Freq: Once | INTRAVENOUS | Status: DC

## 2012-10-24 NOTE — ED Notes (Signed)
Left lower Abdominal pain began last night, described as constant ache with intermittent sharp pains.. States nausea and body aches also. Denies V/D.

## 2012-10-24 NOTE — ED Notes (Signed)
Pt called and informed staff she had completed second bottle of contrast. CT department notified.

## 2012-10-24 NOTE — ED Provider Notes (Signed)
History    CSN: [id number] Arrival date & time 10/24/12  0747  First MD Initiated Contact with Patient 10/24/12 [phone]     Chief Complaint  Patient presents with  . Abdominal Pain  . Nausea   (Consider location/radiation/quality/duration/timing/severity/associated sxs/prior Treatment) HPI Comments: Kristy Little is a 22 y.o. female who presents to the Emergency Department complaining of sudden onset of left lower abdominal pain and left flank pain.  Describes the pain as sharp and constant, worse with movement.  Pain improves "whenI lay on my side and ball up".  Also c/o nausea and last BM was 2 days ago.  She denies fever, vomiting, dysuria, chest pain or shortness of breath.  She also denies new sexual partners, vaginal bleeding or discharge, or hx of kidney stones or previous abdominal surgeries.    Patient is a 22 y.o. female presenting with abdominal pain. The history is provided by the patient.  Abdominal Pain This is a new problem. The current episode started yesterday. The problem occurs constantly. The problem has been unchanged. Associated symptoms include abdominal pain and nausea. Pertinent negatives include no arthralgias, change in bowel habit, chest pain, chills, congestion, fever, headaches, neck pain, numbness, rash, sore throat, swollen glands, urinary symptoms, vertigo, vomiting or weakness. Exacerbated by: movement. She has tried nothing for the symptoms. The treatment provided no relief.   Past Medical History  Diagnosis Date  . No pertinent past medical history   . Migraine    History reviewed. No pertinent past surgical history. Family History  Problem Relation Age of Onset  . Stroke Mother   . Hypertension Father   . Stroke Father   . Diabetes Father    History  Substance Use Topics  . Smoking status: Never Smoker   . Smokeless tobacco: Never Used  . Alcohol Use: No   OB History   Grav Para Term Preterm Abortions TAB SAB Ect Mult Living   1    1 1           Review of Systems  Constitutional: Negative for fever, chills and appetite change.  HENT: Negative for congestion, sore throat and neck pain.   Respiratory: Negative for chest tightness and shortness of breath.   Cardiovascular: Negative for chest pain.  Gastrointestinal: Positive for nausea and abdominal pain. Negative for vomiting, blood in stool and change in bowel habit.  Genitourinary: Positive for flank pain. Negative for dysuria, hematuria, decreased urine volume, vaginal bleeding, vaginal discharge, difficulty urinating, genital sores and menstrual problem.  Musculoskeletal: Negative for back pain and arthralgias.  Skin: Negative for color change and rash.  Neurological: Negative for dizziness, vertigo, weakness, numbness and headaches.  Hematological: Negative for adenopathy.  All other systems reviewed and are negative.    Allergies  Latex  Home Medications   Current Outpatient Rx  Name  Route  Sig  Dispense  Refill  . medroxyPROGESTERone (DEPO-PROVERA) 150 MG/ML injection   Intramuscular   Inject 150 mg into the muscle every 3 (three) months.         . Multiple Vitamin (MULTIVITAMIN WITH MINERALS) TABS   Oral   Take 1 tablet by mouth daily.          BP 112/72  Pulse 74  Temp(Src) 97.9 F (36.6 C) (Oral)  Resp 17  Ht 5' 5.75" (1.67 m)  Wt 111 lb (50.349 kg)  BMI 18.05 kg/m2  SpO2 98%  LMP 08/25/2012 Physical Exam  Nursing note and vitals reviewed. Constitutional: She is oriented  to person, place, and time. She appears well-developed and well-nourished. No distress.  HENT:  Head: Normocephalic and atraumatic.  Mouth/Throat: Oropharynx is clear and moist.  Cardiovascular: Normal rate, regular rhythm, normal heart sounds and intact distal pulses.   No murmur heard. Pulmonary/Chest: Effort normal and breath sounds normal. No respiratory distress. She exhibits no tenderness.  Abdominal: Soft. Normal appearance and bowel sounds are normal. She  exhibits no distension and no mass. There is no splenomegaly. There is tenderness in the left lower quadrant. There is no rigidity, no rebound, no guarding and no tenderness at McBurney's point.    +ttp of the LLQ and left flank.  No epigastric or right sided tenderness, no guarding  Genitourinary: Uterus normal. There is no lesion or injury on the right labia. There is no lesion or injury on the left labia. Cervix exhibits motion tenderness. Cervix exhibits no discharge and no friability. Right adnexum displays no mass and no tenderness. Left adnexum displays no mass and no tenderness. No tenderness or bleeding around the vagina. No foreign body around the vagina. No signs of injury around the vagina. Vaginal discharge found.  Musculoskeletal: She exhibits no edema.  Neurological: She is alert and oriented to person, place, and time. She exhibits normal muscle tone. Coordination normal.  Skin: Skin is warm and dry.    ED Course  Procedures (including critical care time) Results for orders placed during the hospital encounter of 10/24/12  WET PREP, GENITAL      Result Value Range   Yeast Wet Prep HPF POC NONE SEEN  NONE SEEN   Trich, Wet Prep NONE SEEN  NONE SEEN   Clue Cells Wet Prep HPF POC MANY (*) NONE SEEN   WBC, Wet Prep HPF POC MANY (*) NONE SEEN  GC/CHLAMYDIA PROBE AMP      Result Value Range   CT Probe RNA NEGATIVE  NEGATIVE   GC Probe RNA NEGATIVE  NEGATIVE  URINE CULTURE      Result Value Range   Specimen Description URINE, CLEAN CATCH     Special Requests NONE     Culture  Setup Time 10/24/2012 14:00     Colony Count 30,000 COLONIES/ML     Culture       Value: Multiple bacterial morphotypes present, none predominant. Suggest appropriate recollection if clinically indicated.   Report Status 10/25/2012 FINAL    PREGNANCY, URINE      Result Value Range   Preg Test, Ur NEGATIVE  NEGATIVE  URINALYSIS, ROUTINE W REFLEX MICROSCOPIC      Result Value Range   Color, Urine  YELLOW  YELLOW   APPearance CLEAR  CLEAR   Specific Gravity, Urine 1.020  1.005 - 1.030   pH 7.0  5.0 - 8.0   Glucose, UA NEGATIVE  NEGATIVE mg/dL   Hgb urine dipstick SMALL (*) NEGATIVE   Bilirubin Urine NEGATIVE  NEGATIVE   Ketones, ur NEGATIVE  NEGATIVE mg/dL   Protein, ur NEGATIVE  NEGATIVE mg/dL   Urobilinogen, UA 0.2  0.0 - 1.0 mg/dL   Nitrite NEGATIVE  NEGATIVE   Leukocytes, UA SMALL (*) NEGATIVE  CBC WITH DIFFERENTIAL      Result Value Range   WBC 3.9 (*) 4.0 - 10.5 K/uL   RBC 4.26  3.87 - 5.11 MIL/uL   Hemoglobin 13.4  12.0 - 15.0 g/dL   HCT 16.1  09.6 - 04.5 %   MCV 90.1  78.0 - 100.0 fL   MCH 31.5  26.0 - 34.0  pg   MCHC 34.9  30.0 - 36.0 g/dL   RDW 82.9  56.2 - 13.0 %   Platelets 214  150 - 400 K/uL   Neutrophils Relative % 40 (*) 43 - 77 %   Neutro Abs 1.6 (*) 1.7 - 7.7 K/uL   Lymphocytes Relative 44  12 - 46 %   Lymphs Abs 1.7  0.7 - 4.0 K/uL   Monocytes Relative 11  3 - 12 %   Monocytes Absolute 0.4  0.1 - 1.0 K/uL   Eosinophils Relative 5  0 - 5 %   Eosinophils Absolute 0.2  0.0 - 0.7 K/uL   Basophils Relative 0  0 - 1 %   Basophils Absolute 0.0  0.0 - 0.1 K/uL  COMPREHENSIVE METABOLIC PANEL      Result Value Range   Sodium 140  135 - 145 mEq/L   Potassium 3.7  3.5 - 5.1 mEq/L   Chloride 104  96 - 112 mEq/L   CO2 30  19 - 32 mEq/L   Glucose, Bld 93  70 - 99 mg/dL   BUN 15  6 - 23 mg/dL   Creatinine, Ser 8.65  0.50 - 1.10 mg/dL   Calcium 78.4  8.4 - 69.6 mg/dL   Total Protein 7.9  6.0 - 8.3 g/dL   Albumin 4.4  3.5 - 5.2 g/dL   AST 18  0 - 37 U/L   ALT 11  0 - 35 U/L   Alkaline Phosphatase 42  39 - 117 U/L   Total Bilirubin 0.6  0.3 - 1.2 mg/dL   GFR calc non Af Amer >90  >90 mL/min   GFR calc Af Amer >90  >90 mL/min  URINE MICROSCOPIC-ADD ON      Result Value Range   Squamous Epithelial / LPF FEW (*) RARE   WBC, UA 3-6  <3 WBC/hpf   RBC / HPF 3-6  <3 RBC/hpf   Bacteria, UA MANY (*) RARE      Ct Abdomen Pelvis W Contrast  10/24/2012    *RADIOLOGY REPORT*  Clinical Data: Abdominal pain.  CT ABDOMEN AND PELVIS WITH CONTRAST  Technique:  Multidetector CT imaging of the abdomen and pelvis was performed following the standard protocol during bolus administration of intravenous contrast.  Contrast: OMNIPAQUE IOHEXOL 300 MG/ML  SOLN  Comparison: Chest and two views abdomen earlier this same date.  Findings: The lung bases are clear.  No pleural or pericardial effusion.  The gallbladder, liver, spleen, adrenal glands, pancreas and kidneys appear normal.  The appendix is well visualized and normal appearance.  A small volume of free pelvic fluid is compatible with physiologic change. There is no lymphadenopathy.  The stomach and small and large bowel appear normal. Uterus and adnexa are unremarkable.  No focal bony abnormality is identified.  IMPRESSION: Negative for appendicitis.  Negative examination.   Original Report Authenticated By: Holley Dexter, M.D.   Dg Abd Acute W/chest  10/24/2012   *RADIOLOGY REPORT*  Clinical Data: Left side abdominal pain and nausea since last night  ACUTE ABDOMEN SERIES (ABDOMEN 2 VIEW & CHEST 1 VIEW)  Comparison: Chest radiograph 07/20/2011  Findings: Normal heart size, mediastinal contours, and pulmonary vascularity. Lungs mildly hyperaerated but clear. No pleural effusion or pneumothorax. Osseous structures unremarkable. Normal bowel gas pattern. No bowel dilatation, bowel wall thickening, or free intraperitoneal air. Small probable left pelvic phleboliths. No definite urinary tract calcification.  IMPRESSION: No acute abnormalities.   Original Report Authenticated By: Ulyses Southward,  M.D.    MDM    Patient with LLQ pain and vaginal discharge.  Neg CT of abd/pelvis.  Sx's likely related to PID.  Will treat with IV rocephin, prescribe doxy, flagyl and naproxen .  She is non-toxic appearing , feels better and agrees to return to ER for any worsening sx's.  Referral info given for Dr. Despina Hidden.    Keats Kingry L.  Trisha Mangle, PA-C 10/25/12 2140

## 2012-10-25 LAB — URINE CULTURE

## 2012-10-28 NOTE — ED Provider Notes (Signed)
Medical screening examination/treatment/procedure(s) were performed by non-physician practitioner and as supervising physician I was immediately available for consultation/collaboration.  Charles B. Sheldon, MD 10/28/12 1655 

## 2013-03-12 ENCOUNTER — Encounter (HOSPITAL_COMMUNITY): Payer: Self-pay | Admitting: Emergency Medicine

## 2013-03-12 ENCOUNTER — Emergency Department (HOSPITAL_COMMUNITY)
Admission: EM | Admit: 2013-03-12 | Discharge: 2013-03-12 | Disposition: A | Discharge status: Home / Self Care | Payer: Self-pay | Attending: Emergency Medicine | Admitting: Emergency Medicine

## 2013-03-12 DIAGNOSIS — F17200 Nicotine dependence, unspecified, uncomplicated: Secondary | ICD-10-CM | POA: Insufficient documentation

## 2013-03-12 DIAGNOSIS — Z8669 Personal history of other diseases of the nervous system and sense organs: Secondary | ICD-10-CM | POA: Insufficient documentation

## 2013-03-12 DIAGNOSIS — Z91040 Latex allergy status: Secondary | ICD-10-CM | POA: Insufficient documentation

## 2013-03-12 DIAGNOSIS — Z79899 Other long term (current) drug therapy: Secondary | ICD-10-CM | POA: Insufficient documentation

## 2013-03-12 DIAGNOSIS — Z792 Long term (current) use of antibiotics: Secondary | ICD-10-CM | POA: Insufficient documentation

## 2013-03-12 DIAGNOSIS — J029 Acute pharyngitis, unspecified: Secondary | ICD-10-CM | POA: Insufficient documentation

## 2013-03-12 MED ORDER — DEXAMETHASONE 6 MG PO TABS
12.0000 mg | ORAL_TABLET | Freq: Once | ORAL | Status: AC
  Administered 2013-03-12: 12 mg via ORAL
  Filled 2013-03-12: qty 2

## 2013-03-12 MED ORDER — OXYCODONE-ACETAMINOPHEN 5-325 MG PO TABS
1.0000 | ORAL_TABLET | Freq: Once | ORAL | Status: AC
  Administered 2013-03-12: 1 via ORAL
  Filled 2013-03-12: qty 1

## 2013-03-12 MED ORDER — OXYCODONE-ACETAMINOPHEN 5-325 MG PO TABS: 1.0000 | ORAL_TABLET | ORAL | Status: DC | PRN

## 2013-03-12 MED ORDER — DEXAMETHASONE 4 MG PO TABS
ORAL_TABLET | ORAL | Status: AC
  Filled 2013-03-12: qty 3

## 2013-03-12 NOTE — ED Provider Notes (Signed)
CSN: [id number]     Arrival date & time 03/12/13  0203 History   First MD Initiated Contact with Patient 03/12/13 0414     Chief Complaint  Patient presents with  . Sore Throat   (Consider location/radiation/quality/duration/timing/severity/associated sxs/prior Treatment) Patient is a 22 y.o. female presenting with pharyngitis. The history is provided by the patient.  Sore Throat  She had onset 6 days ago of severe sore throat which is getting worse. She rates pain at 8/10. It is worse with swallowing but nothing makes it better. She's tried taking a variety of over-the-counter measures without any benefit. She has had a slight cough which did have some slight streaks of blood on one occasion. Denies fever, chills, sweats. She denies arthralgias or myalgias. He does states he feels generally weak. There's been no vomiting or diarrhea. She denies any sick contacts.  Past Medical History  Diagnosis Date  . No pertinent past medical history   . Migraine    History reviewed. No pertinent past surgical history. Family History  Problem Relation Age of Onset  . Stroke Mother   . Hypertension Father   . Stroke Father   . Diabetes Father    History  Substance Use Topics  . Smoking status: Current Every Day Smoker  . Smokeless tobacco: Never Used  . Alcohol Use: Yes   OB History   Grav Para Term Preterm Abortions TAB SAB Ect Mult Living   1    1 1          Review of Systems  All other systems reviewed and are negative.    Allergies  Latex  Home Medications   Current Outpatient Rx  Name  Route  Sig  Dispense  Refill  . Multiple Vitamin (MULTIVITAMIN WITH MINERALS) TABS   Oral   Take 1 tablet by mouth daily.         Marland Kitchen doxycycline (VIBRAMYCIN) 100 MG capsule   Oral   Take 1 capsule (100 mg total) by mouth 2 (two) times daily.   20 capsule   0   . medroxyPROGESTERone (DEPO-PROVERA) 150 MG/ML injection   Intramuscular   Inject 150 mg into the muscle every 3 (three)  months.         . metroNIDAZOLE (FLAGYL) 500 MG tablet   Oral   Take 1 tablet (500 mg total) by mouth 2 (two) times daily.   20 tablet   0   . naproxen (NAPROSYN) 500 MG tablet   Oral   Take 1 tablet (500 mg total) by mouth 2 (two) times daily with a meal.   20 tablet   0    BP 109/71  Pulse 88  Temp(Src) 98.3 F (36.8 C) (Oral)  Resp 20  Ht 5\' 5"  (1.651 m)  Wt 117 lb (53.071 kg)  BMI 19.47 kg/m2  SpO2 99%  LMP 02/15/2013 Physical Exam  Nursing note and vitals reviewed.  22 year old female, resting comfortably and in no acute distress. Vital signs are normal. Oxygen saturation is 99%, which is normal. Head is normocephalic and atraumatic. PERRLA, EOMI. Oropharynx is moderately erythematous without exudate or tonsillar hypertrophy. She is handling her secretions well and has normal phonation.. Neck is nontender and supple without JVD. There is mild to moderate anterior and posterior cervical lymphadenopathy bilaterally. Back is nontender and there is no CVA tenderness. Lungs are clear without rales, wheezes, or rhonchi. Chest is nontender. Heart has regular rate and rhythm without murmur. Abdomen is soft, flat, nontender without  masses or hepatosplenomegaly and peristalsis is normoactive. Extremities have no cyanosis or edema, full range of motion is present. Skin is warm and dry without rash. Neurologic: Mental status is normal, cranial nerves are intact, there are no motor or sensory deficits.  ED Course  Procedures (including critical care time) Labs Review Results for orders placed during the hospital encounter of 03/12/13  RAPID STREP SCREEN      Result Value Range   Streptococcus, Group A Screen (Direct) NEGATIVE  NEGATIVE   MDM   1. Pharyngitis    He is moderately improved with oxygen and-acetaminophen. Strep screen is negative. Culture is pending. She is discharged with prescription for oxycodone and acetaminophen.    Dione Booze, MD 03/12/13 [phone]

## 2013-03-12 NOTE — ED Notes (Signed)
Patient c/o sore throat x 1 week. 

## 2013-03-15 LAB — CULTURE, GROUP A STREP

## 2013-05-07 ENCOUNTER — Emergency Department (HOSPITAL_COMMUNITY)
Admission: EM | Admit: 2013-05-07 | Discharge: 2013-05-07 | Disposition: A | Discharge status: Home / Self Care | Payer: Self-pay | Attending: Emergency Medicine | Admitting: Emergency Medicine

## 2013-05-07 ENCOUNTER — Encounter (HOSPITAL_COMMUNITY): Payer: Self-pay | Admitting: Emergency Medicine

## 2013-05-07 DIAGNOSIS — R51 Headache: Secondary | ICD-10-CM | POA: Insufficient documentation

## 2013-05-07 DIAGNOSIS — M79609 Pain in unspecified limb: Secondary | ICD-10-CM | POA: Insufficient documentation

## 2013-05-07 DIAGNOSIS — M79605 Pain in left leg: Secondary | ICD-10-CM

## 2013-05-07 DIAGNOSIS — R000 Tachycardia, unspecified: Secondary | ICD-10-CM | POA: Insufficient documentation

## 2013-05-07 DIAGNOSIS — Z91040 Latex allergy status: Secondary | ICD-10-CM | POA: Insufficient documentation

## 2013-05-07 DIAGNOSIS — F17200 Nicotine dependence, unspecified, uncomplicated: Secondary | ICD-10-CM | POA: Insufficient documentation

## 2013-05-07 DIAGNOSIS — Z8679 Personal history of other diseases of the circulatory system: Secondary | ICD-10-CM | POA: Insufficient documentation

## 2013-05-07 DIAGNOSIS — M79604 Pain in right leg: Secondary | ICD-10-CM

## 2013-05-07 MED ORDER — KETOROLAC TROMETHAMINE 30 MG/ML IJ SOLN
30.0000 mg | Freq: Once | INTRAMUSCULAR | Status: AC
  Administered 2013-05-07: 30 mg via INTRAVENOUS
  Filled 2013-05-07: qty 1

## 2013-05-07 MED ORDER — PROCHLORPERAZINE EDISYLATE 5 MG/ML IJ SOLN
10.0000 mg | Freq: Once | INTRAMUSCULAR | Status: AC
  Administered 2013-05-07: 10 mg via INTRAVENOUS
  Filled 2013-05-07: qty 2

## 2013-05-07 NOTE — Discharge Instructions (Signed)
Pain of Unknown Etiology (Pain Without a Known Cause) °You have come to your caregiver because of pain. Pain can occur in any part of the body. Often there is not a definite cause. If your laboratory (blood or urine) work was normal and X-rays or other studies were normal, your caregiver may treat you without knowing the cause of the pain. An example of this is the headache. Most headaches are diagnosed by taking a history. This means your caregiver asks you questions about your headaches. Your caregiver determines a treatment based on your answers. Usually testing done for headaches is normal. Often testing is not done unless there is no response to medications. Regardless of where your pain is located today, you can be given medications to make you comfortable. If no physical cause of pain can be found, most cases of pain will gradually leave as suddenly as they came.  °If you have a painful condition and no reason can be found for the pain, it is important that you follow up with your caregiver. If the pain becomes worse or does not go away, it may be necessary to repeat tests and look further for a possible cause. °· Only take over-the-counter or prescription medicines for pain, discomfort, or fever as directed by your caregiver. °· For the protection of your privacy, test results cannot be given over the phone. Make sure you receive the results of your test. Ask how these results are to be obtained if you have not been informed. It is your responsibility to obtain your test results. °· You may continue all activities unless the activities cause more pain. When the pain lessens, it is important to gradually resume normal activities. Resume activities by beginning slowly and gradually increasing the intensity and duration of the activities or exercise. During periods of severe pain, bed rest may be helpful. Lie or sit in any position that is comfortable. °· Ice used for acute (sudden) conditions may be effective.  Use a large plastic bag filled with ice and wrapped in a towel. This may provide pain relief. °· See your caregiver for continued problems. Your caregiver can help or refer you for exercises or physical therapy if necessary. °If you were given medications for your condition, do not drive, operate machinery or power tools, or sign legal documents for 24 hours. Do not drink alcohol, take sleeping pills, or take other medications that may interfere with treatment. °See your caregiver immediately if you have pain that is becoming worse and not relieved by medications. °Document Released: 12/21/2000 Document Revised: 01/16/2013 Document Reviewed: 03/28/2005 °[employer]® Patient Information ©2014 [employer]. ° °

## 2013-05-07 NOTE — ED Notes (Signed)
Pt with bilateral leg pain -left worse than right, burning in nature per pt, ongoing for months but has become severe per pt, family hx of diabetes and sickle cell; + cough

## 2013-05-07 NOTE — ED Notes (Signed)
Pt denies having PCP

## 2013-05-07 NOTE — ED Notes (Signed)
Patient complaining of body aches that started this morning. Reports burning to legs bilaterally for months.

## 2013-05-07 NOTE — ED Provider Notes (Signed)
CSN: [id number]     Arrival date & time 05/07/13  1926 History   First MD Initiated Contact with Patient 05/07/13 2116     Chief Complaint  Patient presents with  . Generalized Body Aches   (Consider location/radiation/quality/duration/timing/severity/associated sxs/prior Treatment) The history is provided by the patient.   patient presents with burning pain in both of her lower legs. She states it starts above her knee and goes down. She's had episodes of this before, but states this is more severe. She states she's been having for months to years. It is usually worse in her left leg. No trauma. She did have a headache earlier today. She states she often has headaches with the events, but not always. No numbness or weakness. No fevers. She has had a mild sore throat. She denies possibility of pregnancy. No Abdominal pain. No nausea or vomiting.  Past Medical History  Diagnosis Date  . No pertinent past medical history   . Migraine    History reviewed. No pertinent past surgical history. Family History  Problem Relation Age of Onset  . Stroke Mother   . Hypertension Father   . Stroke Father   . Diabetes Father    History  Substance Use Topics  . Smoking status: Current Every Day Smoker  . Smokeless tobacco: Never Used  . Alcohol Use: Yes   OB History   Grav Para Term Preterm Abortions TAB SAB Ect Mult Living   1    1 1          Review of Systems  Constitutional: Negative for activity change and appetite change.  Eyes: Negative for pain.  Respiratory: Negative for chest tightness and shortness of breath.   Cardiovascular: Negative for chest pain and leg swelling.  Gastrointestinal: Negative for nausea, vomiting, abdominal pain and diarrhea.  Genitourinary: Negative for flank pain.  Musculoskeletal: Negative for gait problem and neck stiffness.  Skin: Negative for rash.  Neurological: Negative for weakness, numbness and headaches.  Psychiatric/Behavioral: Negative for  behavioral problems.    Allergies  Latex  Home Medications   Current Outpatient Rx  Name  Route  Sig  Dispense  Refill  . ibuprofen (ADVIL,MOTRIN) 800 MG tablet   Oral   Take 800 mg by mouth 2 (two) times daily as needed for headache.          BP 112/60  Pulse 87  Temp(Src) 98.6 F (37 C) (Oral)  Resp 20  Ht 5\' 4"  (1.626 m)  Wt 119 lb (53.978 kg)  BMI 20.42 kg/m2  SpO2 95%  LMP 04/17/2013 Physical Exam  Nursing note and vitals reviewed. Constitutional: She is oriented to person, place, and time. She appears well-developed and well-nourished.  HENT:  Head: Normocephalic.  Eyes: Conjunctivae are normal. Pupils are equal, round, and reactive to light.  Cardiovascular: Regular rhythm and normal heart sounds.   No murmur heard. Mild tachycardia  Pulmonary/Chest: Effort normal and breath sounds normal. No respiratory distress. She has no wheezes. She has no rales.  Abdominal: Soft. Bowel sounds are normal. She exhibits no distension. There is no tenderness. There is no rebound and no guarding.  Musculoskeletal: Normal range of motion. She exhibits tenderness.  Tenderness over bilateral lower extremities. Strong dorsalis pedis pulse bilaterally. No edema. Pain with flexion and extension at the ankle. Pain with palpation of the calves bilaterally. Pain to palpation of the skin.  Neurological: She is alert and oriented to person, place, and time. No cranial nerve deficit.  Skin: Skin is  warm and dry.  Psychiatric: She has a normal mood and affect. Her speech is normal.    ED Course  Procedures (including critical care time) Labs Review Labs Reviewed - No data to display Imaging Review No results found.  EKG Interpretation   None       MDM   1. Lower extremity pain, bilateral    Patient pain in her bilateral lower extremities. She's had multiple previous episodes of this. She does have headaches at times with it. She has good pulses and no signs of infection.  Patient was treated with Toradol and Compazine. The thought of complicated migraine was entertained. Patient feels better and will be discharged home. She's had a previous CK and electrolytes checked with prior episodes.    Juliet Rude. Rubin Payor, MD 05/07/13 [phone]

## 2013-11-04 ENCOUNTER — Emergency Department (HOSPITAL_COMMUNITY): Payer: Self-pay

## 2013-11-04 ENCOUNTER — Emergency Department (HOSPITAL_COMMUNITY)
Admission: EM | Admit: 2013-11-04 | Discharge: 2013-11-04 | Discharge status: Against Medical Advice | Payer: Self-pay | Attending: Emergency Medicine | Admitting: Emergency Medicine

## 2013-11-04 ENCOUNTER — Encounter (HOSPITAL_COMMUNITY): Payer: Self-pay | Admitting: Emergency Medicine

## 2013-11-04 DIAGNOSIS — F17200 Nicotine dependence, unspecified, uncomplicated: Secondary | ICD-10-CM | POA: Insufficient documentation

## 2013-11-04 DIAGNOSIS — R519 Headache, unspecified: Secondary | ICD-10-CM

## 2013-11-04 DIAGNOSIS — Z8679 Personal history of other diseases of the circulatory system: Secondary | ICD-10-CM | POA: Insufficient documentation

## 2013-11-04 DIAGNOSIS — Z91040 Latex allergy status: Secondary | ICD-10-CM | POA: Insufficient documentation

## 2013-11-04 DIAGNOSIS — R51 Headache: Secondary | ICD-10-CM | POA: Insufficient documentation

## 2013-11-04 LAB — BASIC METABOLIC PANEL
Anion gap: 13 (ref 5–15)
BUN: 14 mg/dL (ref 6–23)
CHLORIDE: 102 meq/L (ref 96–112)
CO2: 25 meq/L (ref 19–32)
CREATININE: 0.6 mg/dL (ref 0.50–1.10)
Calcium: 9.7 mg/dL (ref 8.4–10.5)
GFR calc non Af Amer: >90 mL/min (ref >90)
GLUCOSE: 88 mg/dL (ref 70–99)
Potassium: 3.4 mEq/L — ABNORMAL LOW (ref 3.7–5.3)
Sodium: 140 mEq/L (ref 137–147)

## 2013-11-04 LAB — CBC WITH DIFFERENTIAL/PLATELET
BASOS PCT: 0 % (ref 0–1)
Basophils Absolute: 0 10*3/uL (ref 0.0–0.1)
Eosinophils Absolute: 0.2 10*3/uL (ref 0.0–0.7)
Eosinophils Relative: 4 % (ref 0–5)
HEMATOCRIT: 37.2 % (ref 36.0–46.0)
HEMOGLOBIN: 12.2 g/dL (ref 12.0–15.0)
LYMPHS ABS: 2.1 10*3/uL (ref 0.7–4.0)
Lymphocytes Relative: 50 % — ABNORMAL HIGH (ref 12–46)
MCH: 29.8 pg (ref 26.0–34.0)
MCHC: 32.8 g/dL (ref 30.0–36.0)
MCV: 91 fL (ref 78.0–100.0)
MONO ABS: 0.4 10*3/uL (ref 0.1–1.0)
MONOS PCT: 10 % (ref 3–12)
NEUTROS ABS: 1.5 10*3/uL — AB (ref 1.7–7.7)
Neutrophils Relative %: 36 % — ABNORMAL LOW (ref 43–77)
Platelets: 240 10*3/uL (ref 150–400)
RBC: 4.09 MIL/uL (ref 3.87–5.11)
RDW: 12.3 % (ref 11.5–15.5)
WBC: 4.2 10*3/uL (ref 4.0–10.5)

## 2013-11-04 MED ORDER — FENTANYL CITRATE 0.05 MG/ML IJ SOLN
50.0000 ug | Freq: Once | INTRAMUSCULAR | Status: AC
  Administered 2013-11-04: 50 ug via INTRAVENOUS
  Filled 2013-11-04: qty 2

## 2013-11-04 MED ORDER — KETOROLAC TROMETHAMINE 30 MG/ML IJ SOLN
30.0000 mg | Freq: Once | INTRAMUSCULAR | Status: AC
  Administered 2013-11-04: 30 mg via INTRAVENOUS
  Filled 2013-11-04: qty 1

## 2013-11-04 MED ORDER — METOCLOPRAMIDE HCL 5 MG/ML IJ SOLN
10.0000 mg | Freq: Once | INTRAMUSCULAR | Status: AC
  Administered 2013-11-04: 10 mg via INTRAVENOUS
  Filled 2013-11-04: qty 2

## 2013-11-04 MED ORDER — SODIUM CHLORIDE 0.9 % IV BOLUS (SEPSIS)
1000.0000 mL | Freq: Once | INTRAVENOUS | Status: AC
  Administered 2013-11-04: 1000 mL via INTRAVENOUS

## 2013-11-04 NOTE — ED Notes (Addendum)
Informed patient the MD is in with a trauma patient but patient reports she just wants to leave. Informed patient to come back to ED if new or worsening symptoms. Pt states she understands the risks and benefits of leaving AMA. Pt observed leaving ED with steady gait, NAD. Pt did not want to wait for updated set of vital signs.

## 2013-11-04 NOTE — ED Notes (Signed)
Dr. Nanavati at bedside 

## 2013-11-04 NOTE — ED Provider Notes (Signed)
CSN: [id number]     Arrival date & time 11/04/13  [id number] History   First MD Initiated Contact with Patient 11/04/13 [phone]     Chief Complaint  Patient presents with  . Headache     (Consider location/radiation/quality/duration/timing/severity/associated sxs/prior Treatment) HPI Comments: PT comes in with cc of headaches. Pt reports headaches starting y'day. Headaches are frontal, and sharp, throbbing, with blurry vision. No confusion, seizures, gait problems. No fam hx of brain AN, or tumors or bleeds. Pt has no numbness, tingling. PT took oral meds at home with no relief.  Patient is a 23 y.o. female presenting with headaches. The history is provided by the patient.  Headache Associated symptoms: no dizziness, no nausea and no numbness     Past Medical History  Diagnosis Date  . No pertinent past medical history   . Migraine    History reviewed. No pertinent past surgical history. Family History  Problem Relation Age of Onset  . Stroke Mother   . Hypertension Father   . Stroke Father   . Diabetes Father    History  Substance Use Topics  . Smoking status: Current Every Day Smoker  . Smokeless tobacco: Never Used  . Alcohol Use: Yes   OB History   Grav Para Term Preterm Abortions TAB SAB Ect Mult Living   1    1 1          Review of Systems  Constitutional: Positive for activity change.  Eyes: Positive for visual disturbance.  Cardiovascular: Negative for chest pain.  Gastrointestinal: Negative for nausea.  Allergic/Immunologic: Negative for immunocompromised state.  Neurological: Positive for headaches. Negative for dizziness, syncope, facial asymmetry, speech difficulty and numbness.      Allergies  Latex  Home Medications   Prior to Admission medications   Medication Sig Start Date End Date Taking? Authorizing Provider  ibuprofen (ADVIL,MOTRIN) 800 MG tablet Take 800 mg by mouth 2 (two) times daily as needed for headache.   Yes Historical Provider, MD   BP  128/66  Pulse 73  Temp(Src) 98.4 F (36.9 C) (Oral)  Resp 19  SpO2 100%  LMP 10/28/2013 Physical Exam  Nursing note and vitals reviewed. Constitutional: She is oriented to person, place, and time. She appears well-developed and well-nourished.  HENT:  Head: Normocephalic and atraumatic.  Eyes: EOM are normal. Pupils are equal, round, and reactive to light.  Neck: Neck supple.  Cardiovascular: Normal rate, regular rhythm and normal heart sounds.   No murmur heard. Pulmonary/Chest: Effort normal. No respiratory distress.  Abdominal: Soft. She exhibits no distension. There is no tenderness. There is no rebound and no guarding.  Neurological: She is alert and oriented to person, place, and time. No cranial nerve deficit. Coordination normal.  Skin: Skin is warm and dry.    ED Course  Procedures (including critical care time) Labs Review Labs Reviewed  CBC WITH DIFFERENTIAL - Abnormal; Notable for the following:    Neutrophils Relative % 36 (*)    Neutro Abs 1.5 (*)    Lymphocytes Relative 50 (*)    All other components within normal limits  BASIC METABOLIC PANEL - Abnormal; Notable for the following:    Potassium 3.4 (*)    All other components within normal limits    Imaging Review No results found.   EKG Interpretation None      MDM   Final diagnoses:  None  Headaches  DDX includes: Primary headaches - including migrainous headaches, cluster headaches, tension headaches. ICH Carotid dissection  Cavernous sinus thrombosis Meningitis Encephalitis Sinusitis Tumor Vascular headaches AV malformation Brain aneurysm Muscular headaches  A/P: Pt comes in with cc of headaches. Headaches are worse than her usual headaches, and she has some blurry vision. Suspicion for [facility] is low, but given the significant headaches, with no blurry vision, no personal hx of migraines - we might have to do a CT --> LP to r/o [facility] is pt doesn't improve and the suspicion  heightens.  LATE ENTRY:  Pt left AMA.    Derwood Kaplan, MD 11/13/13 [phone]

## 2013-11-04 NOTE — ED Notes (Addendum)
Pt stating she wants to go home because her ride home is about to leave her. This RN asked how her headache was and pt stated, "it will be alright." Dr. Rhunette CroftNanavati informed.

## 2013-11-04 NOTE — ED Notes (Signed)
Pt requesting more pain medication. Dr. Rhunette Croftnanavati made aware. Pt updated.

## 2013-11-04 NOTE — ED Notes (Signed)
Pt reports headache, started yesterday. Hx of headaches but reports this is worse because medication has not helped. She reports blurry vision and difficulty falling to sleep. A&Ox4, no neuro deficits noted at this time.

## 2014-01-08 ENCOUNTER — Emergency Department (HOSPITAL_COMMUNITY): Payer: Self-pay

## 2014-01-08 ENCOUNTER — Emergency Department (HOSPITAL_COMMUNITY)
Admission: EM | Admit: 2014-01-08 | Discharge: 2014-01-08 | Disposition: A | Discharge status: Home / Self Care | Payer: Self-pay | Attending: Emergency Medicine | Admitting: Emergency Medicine

## 2014-01-08 ENCOUNTER — Encounter (HOSPITAL_COMMUNITY): Payer: Self-pay | Admitting: Emergency Medicine

## 2014-01-08 DIAGNOSIS — Z3491 Encounter for supervision of normal pregnancy, unspecified, first trimester: Secondary | ICD-10-CM

## 2014-01-08 DIAGNOSIS — R1031 Right lower quadrant pain: Secondary | ICD-10-CM

## 2014-01-08 DIAGNOSIS — Z791 Long term (current) use of non-steroidal anti-inflammatories (NSAID): Secondary | ICD-10-CM | POA: Insufficient documentation

## 2014-01-08 DIAGNOSIS — R1032 Left lower quadrant pain: Secondary | ICD-10-CM

## 2014-01-08 DIAGNOSIS — R109 Unspecified abdominal pain: Secondary | ICD-10-CM | POA: Insufficient documentation

## 2014-01-08 DIAGNOSIS — O99330 Smoking (tobacco) complicating pregnancy, unspecified trimester: Secondary | ICD-10-CM | POA: Insufficient documentation

## 2014-01-08 DIAGNOSIS — Z91040 Latex allergy status: Secondary | ICD-10-CM | POA: Insufficient documentation

## 2014-01-08 DIAGNOSIS — O9989 Other specified diseases and conditions complicating pregnancy, childbirth and the puerperium: Secondary | ICD-10-CM | POA: Insufficient documentation

## 2014-01-08 LAB — URINALYSIS, ROUTINE W REFLEX MICROSCOPIC
Bilirubin Urine: NEGATIVE
GLUCOSE, UA: NEGATIVE mg/dL
HGB URINE DIPSTICK: NEGATIVE
Ketones, ur: NEGATIVE mg/dL
Leukocytes, UA: NEGATIVE
NITRITE: NEGATIVE
Protein, ur: NEGATIVE mg/dL
SPECIFIC GRAVITY, URINE: 1.01 (ref 1.005–1.030)
Urobilinogen, UA: 0.2 mg/dL (ref 0.0–1.0)
pH: 7 (ref 5.0–8.0)

## 2014-01-08 LAB — CBC
HEMATOCRIT: 33.3 % — AB (ref 36.0–46.0)
HEMOGLOBIN: 11.7 g/dL — AB (ref 12.0–15.0)
MCH: 31.4 pg (ref 26.0–34.0)
MCHC: 35.1 g/dL (ref 30.0–36.0)
MCV: 89.3 fL (ref 78.0–100.0)
Platelets: 240 10*3/uL (ref 150–400)
RBC: 3.73 MIL/uL — AB (ref 3.87–5.11)
RDW: 12.1 % (ref 11.5–15.5)
WBC: 5.9 10*3/uL (ref 4.0–10.5)

## 2014-01-08 LAB — HCG, QUANTITATIVE, PREGNANCY: hCG, Beta Chain, Quant, S: 21170 m[IU]/mL — ABNORMAL HIGH (ref <5)

## 2014-01-08 LAB — POC URINE PREG, ED: Preg Test, Ur: POSITIVE — AB

## 2014-01-08 MED ORDER — PRENATAL COMPLETE 14-0.4 MG PO TABS: 1.0000 | ORAL_TABLET | Freq: Every day | ORAL | Status: DC

## 2014-01-08 NOTE — ED Provider Notes (Signed)
Medical screening examination/treatment/procedure(s) were performed by non-physician practitioner and as supervising physician I was immediately available for consultation/collaboration.   EKG Interpretation None        Vanetta MuldersScott Lashia Niese, MD 01/08/14 [phone]

## 2014-01-08 NOTE — ED Provider Notes (Signed)
CSN: [id number]     Arrival date & time 01/08/14  1422 History   First MD Initiated Contact with Patient 01/08/14 1645     Chief Complaint  Patient presents with  . Abdominal Cramping     (Consider location/radiation/quality/duration/timing/severity/associated sxs/prior Treatment) HPI Comments: Kristy Little is a 23 y.o. G1P0 female with a PMHx of chronic migraines, who presents to the ED with complaints of lower abd cramping and states she believes she is [redacted]wks pregnant. States the cramping is located in her lower abd, intermittent, nonradiating, 4/10 in severity, worsened by lying on her L side, and improved by lying on her R side as well as ibuprofen. States this is her first pregnancy. LMP 11/29/13. Endorses some breast tenderness. Denies vaginal bleeding or discharge, dysuria, hematuria, n/v/d/c, obstipation, flank pain, back pain, hematochezia, melena, myalgias, arthralgias, fevers, chills, lightheadedness, dizziness, CP, SOB, URI symptoms, fetal movement, lactation, or other signs of early pregnancy.   Patient is a 23 y.o. female presenting with abdominal pain. The history is provided by the patient. No language interpreter was used.  Abdominal Pain Pain location:  Suprapubic Pain quality: cramping   Pain radiates to:  Does not radiate Pain severity:  Mild (4/10) Onset quality:  Gradual Duration:  3 days Timing:  Intermittent Progression:  Unchanged Chronicity:  New Context: not trauma   Context comment:  Pregnant Relieved by:  NSAIDs and position changes (ibuprofen and lying on her R side) Worsened by:  Position changes (lying on her L side) Ineffective treatments:  None tried Associated symptoms: no anorexia, no chest pain, no chills, no constipation, no diarrhea, no dysuria, no fever, no flatus, no hematemesis, no hematochezia, no hematuria, no melena, no nausea, no shortness of breath, no vaginal bleeding, no vaginal discharge and no vomiting   Risk factors: pregnancy      Past Medical History  Diagnosis Date  . No pertinent past medical history   . Migraine    History reviewed. No pertinent past surgical history. Family History  Problem Relation Age of Onset  . Stroke Mother   . Hypertension Father   . Stroke Father   . Diabetes Father    History  Substance Use Topics  . Smoking status: Current Every Day Smoker  . Smokeless tobacco: Never Used  . Alcohol Use: Yes   OB History   Grav Para Term Preterm Abortions TAB SAB Ect Mult Living   1    1 1          Review of Systems  Constitutional: Negative for fever and chills.  HENT: Negative for congestion.   Respiratory: Negative for shortness of breath.   Cardiovascular: Negative for chest pain.  Gastrointestinal: Positive for abdominal pain. Negative for nausea, vomiting, diarrhea, constipation, blood in stool, melena, hematochezia, rectal pain, anorexia, flatus and hematemesis.  Genitourinary: Positive for menstrual problem (late). Negative for dysuria, urgency, hematuria, flank pain, decreased urine volume, vaginal bleeding, vaginal discharge, difficulty urinating, vaginal pain and dyspareunia.  Musculoskeletal: Negative for arthralgias and myalgias.  Skin: Negative for color change.  Neurological: Negative for dizziness, syncope, weakness, light-headedness and headaches.  Psychiatric/Behavioral: Negative for confusion.    10 Systems reviewed and are negative for acute change except as noted in the HPI.   Allergies  Latex  Home Medications   Prior to Admission medications   Medication Sig Start Date End Date Taking? Authorizing Provider  ibuprofen (ADVIL,MOTRIN) 800 MG tablet Take 800 mg by mouth 2 (two) times daily as needed for headache.  Yes Historical Provider, MD   BP 116/68  Pulse 72  Temp(Src) 98.1 F (36.7 C) (Oral)  Resp 18  SpO2 100%  LMP 11/29/2013 Physical Exam  Nursing note and vitals reviewed. Constitutional: She is oriented to person, place, and time. Vital  signs are normal. She appears well-developed and well-nourished.  Non-toxic appearance. No distress.  VSS, NAD, well appearing, thin female  HENT:  Head: Normocephalic and atraumatic.  Mouth/Throat: Mucous membranes are normal.  Eyes: Conjunctivae and EOM are normal. Right eye exhibits no discharge. Left eye exhibits no discharge.  Neck: Normal range of motion. Neck supple.  Cardiovascular: Normal rate, regular rhythm, normal heart sounds and intact distal pulses.  Exam reveals no gallop and no friction rub.   No murmur heard. Pulmonary/Chest: Effort normal and breath sounds normal. No respiratory distress. She has no wheezes. She has no rales.  Abdominal: Soft. Normal appearance and bowel sounds are normal. She exhibits no distension and no mass. There is no tenderness. There is no rigidity, no rebound, no guarding, no CVA tenderness, no tenderness at McBurney's point and negative Murphy's sign.  Soft, NTND, no r/g/r, neg murphys, neg mcburney's, no CVA TTP. +BS throughout. No palpable uterus.  Musculoskeletal: Normal range of motion.  Neurological: She is alert and oriented to person, place, and time.  Skin: Skin is warm, dry and intact. No rash noted.  Psychiatric: She has a normal mood and affect.    ED Course  Procedures (including critical care time) Labs Review Labs Reviewed  HCG, QUANTITATIVE, PREGNANCY - Abnormal; Notable for the following:    hCG, Beta Chain, [place] [address] (*)    All other components within normal limits  CBC - Abnormal; Notable for the following:    RBC 3.73 (*)    Hemoglobin 11.7 (*)    HCT 33.3 (*)    All other components within normal limits  POC URINE PREG, ED - Abnormal; Notable for the following:    Preg Test, Ur POSITIVE (*)    All other components within normal limits  URINALYSIS, ROUTINE W REFLEX MICROSCOPIC    Imaging Review US Ob Transvaginal  01/08/2014   CLINICAL DATA:  Pregnant, cramping  EXAM: OBSTETRIC <14 WK [place] AND TRANSVAGINAL OB US   TECHNIQUE: Both transabdominal and transvaginal ultrasound examinations were performed for complete evaluation of the gestation as well as the maternal uterus, adnexal regions, and pelvic cul-de-sac. Transvaginal technique was performed to assess early pregnancy.  COMPARISON:  None.  FINDINGS: Intrauterine gestational sac: Visualized/normal in shape.  Yolk sac:  Present  Embryo:  Present  Cardiac Activity: Present  Heart Rate:  88 bpm  CRL:   2.6  mm   5 w 6 d                  [place] EDC: 09/04/2015  Maternal uterus/adnexae: No subchronic hemorrhage.  Right ovary is within normal limits, measuring 3.1 x 1.6 x 2.2 cm.  Left ovary is within normal limits, measuring 3.2 x 2.1 x 2.0 cm.  Small volume pelvic ascites.  IMPRESSION: Single live intrauterine gestation, measuring 5 weeks 6 days by crown-rump length.   Electronically Signed   By: Charline Bills M.D.   On: 01/08/2014 18:56   US Pelvis Complete  01/08/2014   CLINICAL DATA:  Pregnant, cramping  EXAM: OBSTETRIC <14 WK [place] AND TRANSVAGINAL OB US  TECHNIQUE: Both transabdominal and transvaginal ultrasound examinations were performed for complete evaluation of the gestation as well as the maternal uterus,  adnexal regions, and pelvic cul-de-sac. Transvaginal technique was performed to assess early pregnancy.  COMPARISON:  None.  FINDINGS: Intrauterine gestational sac: Visualized/normal in shape.  Yolk sac:  Present  Embryo:  Present  Cardiac Activity: Present  Heart Rate:  88 bpm  CRL:   2.6  mm   5 w 6 d                  [place] EDC: 09/04/2015  Maternal uterus/adnexae: No subchronic hemorrhage.  Right ovary is within normal limits, measuring 3.1 x 1.6 x 2.2 cm.  Left ovary is within normal limits, measuring 3.2 x 2.1 x 2.0 cm.  Small volume pelvic ascites.  IMPRESSION: Single live intrauterine gestation, measuring 5 weeks 6 days by crown-rump length.   Electronically Signed   By: Charline BillsSriyesh  Krishnan M.D.   On: 01/08/2014 18:56     EKG Interpretation None      MDM    Final diagnoses:  Single pregnancy in first trimester  Bilateral lower abdominal cramping    22y/o female with intermittent abd cramping and pregnant. No vaginal discharge or bleeding, therefore did not perform pelvic exam. Abd exam benign. Upreg positive, quant HCG 21,170 c/w dates of ~4-5 weeks. U/A neg, CBC unremarkable aside from mild baseline anemia. U/S obtained to eval for IUP, shows IUP at 3633w6d. Discussed w/ pt to f/up with women's clinic for repeat hcg in 2 days as well as establishing prenatal care. Did not perform ABO/Rh due to no vaginal bleeding. Discussed discontinuing NSAID use, and only using tylenol during pregnancy. Discussed smoking cessation. I explained the diagnosis and have given explicit precautions to return to the ER including for any other new or worsening symptoms. The patient understands and accepts the medical plan as it's been dictated and I have answered their questions. Discharge instructions concerning home care and prescriptions have been given. The patient is STABLE and is discharged to home in good condition.  BP 97/62  Pulse 78  Temp(Src) 98.1 F (36.7 C) (Oral)  Resp 18  SpO2 100%  LMP 11/29/2013  Meds ordered this encounter  Medications  . Prenatal Vit-Fe Fumarate-FA (PRENATAL COMPLETE) 14-0.4 MG TABS    Sig: Take 1 tablet by mouth daily after breakfast.    Dispense:  30 each    Refill:  2    Order Specific Question:  Supervising Provider    Answer:  Vida RollerMILLER, BRIAN D [id number][[id number]]       Miamor Ayler Strupp Camprubi-Soms, PA-C 01/08/14 [id number]

## 2014-01-08 NOTE — Discharge Instructions (Signed)
Your ultrasound reveals that you are pregnant at approximately 5 weeks and 6 days. You need to start taking prenatal vitamins, stop smoking, and do not use ibuprofen or any other NSAIDs for the remainder of your pregnancy. See the women's clinic to establish prenatal care and for a repeat beta-HCG lab in 2 days. Return to the [facility] ER for any changes or worsening symptoms.    Abdominal Pain During Pregnancy Belly (abdominal) pain is common during pregnancy. Most of the time, it is not a serious problem. Other times, it can be a sign that something is wrong with the pregnancy. Always tell your doctor if you have belly pain. HOME CARE Monitor your belly pain for any changes. The following actions may help you feel better:  Do not have sex (intercourse) or put anything in your vagina until you feel better.  Rest until your pain stops.  Drink clear fluids if you feel sick to your stomach (nauseous). Do not eat solid food until you feel better.  Only take medicine as told by your doctor.  Keep all doctor visits as told. GET HELP RIGHT AWAY IF:   You are bleeding, leaking fluid, or pieces of tissue come out of your vagina.  You have more pain or cramping.  You keep throwing up (vomiting).  You have pain when you pee (urinate) or have blood in your pee.  You have a fever.  You do not feel your baby moving as much.  You feel very weak or feel like passing out.  You have trouble breathing, with or without belly pain.  You have a very bad headache and belly pain.  You have fluid leaking from your vagina and belly pain.  You keep having watery poop (diarrhea).  Your belly pain does not go away after resting, or the pain gets worse. MAKE SURE YOU:   Understand these instructions.  Will watch your condition.  Will get help right away if you are not doing well or get worse. Document Released: 03/16/2009 Document Revised: 11/28/2012 Document Reviewed:  10/25/2012 [facility] Patient Information 2015 [place], [place]. This information is not intended to replace advice given to you by your health care provider. Make sure you discuss any questions you have with your health care provider.  First Trimester of Pregnancy The first trimester of pregnancy is from week 1 until the end of week 12 (months 1 through 3). A week after a sperm fertilizes an egg, the egg will implant on the wall of the uterus. This embryo will begin to develop into a baby. Genes from you and your partner are forming the baby. The female genes determine whether the baby is a boy or a girl. At 6-8 weeks, the eyes and face are formed, and the heartbeat can be seen on ultrasound. At the end of 12 weeks, all the baby's organs are formed.  Now that you are pregnant, you will want to do everything you can to have a healthy baby. Two of the most important things are to get good prenatal care and to follow your health care provider's instructions. Prenatal care is all the medical care you receive before the baby's birth. This care will help prevent, find, and treat any problems during the pregnancy and childbirth. BODY CHANGES Your body goes through many changes during pregnancy. The changes vary from woman to woman.   You may gain or lose a couple of pounds at first.  You may feel sick to your stomach (nauseous) and throw  up (vomit). If the vomiting is uncontrollable, call your health care provider.  You may tire easily.  You may develop headaches that can be relieved by medicines approved by your health care provider.  You may urinate more often. Painful urination may mean you have a bladder infection.  You may develop heartburn as a result of your pregnancy.  You may develop constipation because certain hormones are causing the muscles that push waste through your intestines to slow down.  You may develop hemorrhoids or swollen, bulging veins (varicose veins).  Your breasts may begin  to grow larger and become tender. Your nipples may stick out more, and the tissue that surrounds them (areola) may become darker.  Your gums may bleed and may be sensitive to brushing and flossing.  Dark spots or blotches (chloasma, mask of pregnancy) may develop on your face. This will likely fade after the baby is born.  Your menstrual periods will stop.  You may have a loss of appetite.  You may develop cravings for certain kinds of food.  You may have changes in your emotions from day to day, such as being excited to be pregnant or being concerned that something may go wrong with the pregnancy and baby.  You may have more vivid and strange dreams.  You may have changes in your hair. These can include thickening of your hair, rapid growth, and changes in texture. Some women also have hair loss during or after pregnancy, or hair that feels dry or thin. Your hair will most likely return to normal after your baby is born. WHAT TO EXPECT AT YOUR PRENATAL VISITS During a routine prenatal visit:  You will be weighed to make sure you and the baby are growing normally.  Your blood pressure will be taken.  Your abdomen will be measured to track your baby's growth.  The fetal heartbeat will be listened to starting around week 10 or 12 of your pregnancy.  Test results from any previous visits will be discussed. Your health care provider may ask you:  How you are feeling.  If you are feeling the baby move.  If you have had any abnormal symptoms, such as leaking fluid, bleeding, severe headaches, or abdominal cramping.  If you have any questions. Other tests that may be performed during your first trimester include:  Blood tests to find your blood type and to check for the presence of any previous infections. They will also be used to check for low iron levels (anemia) and Rh antibodies. Later in the pregnancy, blood tests for diabetes will be done along with other tests if problems  develop.  Urine tests to check for infections, diabetes, or protein in the urine.  An ultrasound to confirm the proper growth and development of the baby.  An amniocentesis to check for possible genetic problems.  Fetal screens for spina bifida and Down syndrome.  You may need other tests to make sure you and the baby are doing well. HOME CARE INSTRUCTIONS  Medicines  Follow your health care provider's instructions regarding medicine use. Specific medicines may be either safe or unsafe to take during pregnancy.  Take your prenatal vitamins as directed.  If you develop constipation, try taking a stool softener if your health care provider approves. Diet  Eat regular, well-balanced meals. Choose a variety of foods, such as meat or vegetable-based protein, fish, milk and low-fat dairy products, vegetables, fruits, and whole grain breads and cereals. Your health care provider will help you determine  the amount of weight gain that is right for you.  Avoid raw meat and uncooked cheese. These carry germs that can cause birth defects in the baby.  Eating four or five small meals rather than three large meals a day may help relieve nausea and vomiting. If you start to feel nauseous, eating a few soda crackers can be helpful. Drinking liquids between meals instead of during meals also seems to help nausea and vomiting.  If you develop constipation, eat more high-fiber foods, such as fresh vegetables or fruit and whole grains. Drink enough fluids to keep your urine clear or pale yellow. Activity and Exercise  Exercise only as directed by your health care provider. Exercising will help you:  Control your weight.  Stay in shape.  Be prepared for labor and delivery.  Experiencing pain or cramping in the lower abdomen or low back is a good sign that you should stop exercising. Check with your health care provider before continuing normal exercises.  Try to avoid standing for long periods of  time. Move your legs often if you must stand in one place for a long time.  Avoid heavy lifting.  Wear low-heeled shoes, and practice good posture.  You may continue to have sex unless your health care provider directs you otherwise. Relief of Pain or Discomfort  Wear a good support bra for breast tenderness.   Take warm sitz baths to soothe any pain or discomfort caused by hemorrhoids. Use hemorrhoid cream if your health care provider approves.   Rest with your legs elevated if you have leg cramps or low back pain.  If you develop varicose veins in your legs, wear support hose. Elevate your feet for 15 minutes, 3-4 times a day. Limit salt in your diet. Prenatal Care  Schedule your prenatal visits by the twelfth week of pregnancy. They are usually scheduled monthly at first, then more often in the last 2 months before delivery.  Write down your questions. Take them to your prenatal visits.  Keep all your prenatal visits as directed by your health care provider. Safety  Wear your seat belt at all times when driving.  Make a list of emergency phone numbers, including numbers for family, friends, the hospital, and police and fire departments. General Tips  Ask your health care provider for a referral to a local prenatal education class. Begin classes no later than at the beginning of month 6 of your pregnancy.  Ask for help if you have counseling or nutritional needs during pregnancy. Your health care provider can offer advice or refer you to specialists for help with various needs.  Do not use hot tubs, steam rooms, or saunas.  Do not douche or use tampons or scented sanitary pads.  Do not cross your legs for long periods of time.  Avoid cat litter boxes and soil used by cats. These carry germs that can cause birth defects in the baby and possibly loss of the fetus by miscarriage or stillbirth.  Avoid all smoking, herbs, alcohol, and medicines not prescribed by your health  care provider. Chemicals in these affect the formation and growth of the baby.  Schedule a dentist appointment. At home, brush your teeth with a soft toothbrush and be gentle when you floss. SEEK MEDICAL CARE IF:   You have dizziness.  You have mild pelvic cramps, pelvic pressure, or nagging pain in the abdominal area.  You have persistent nausea, vomiting, or diarrhea.  You have a bad smelling vaginal discharge.  You have pain with urination.  You notice increased swelling in your face, hands, legs, or ankles. SEEK IMMEDIATE MEDICAL CARE IF:   You have a fever.  You are leaking fluid from your vagina.  You have spotting or bleeding from your vagina.  You have severe abdominal cramping or pain.  You have rapid weight gain or loss.  You vomit blood or material that looks like coffee grounds.  You are exposed to [place] measles and have never had them.  You are exposed to fifth disease or chickenpox.  You develop a severe headache.  You have shortness of breath.  You have any kind of trauma, such as from a fall or a car accident. Document Released: 03/22/2001 Document Revised: 08/12/2013 Document Reviewed: 02/05/2013 [facility] Patient Information 2015 [place]. This information is not intended to replace advice given to you by your health care provider. Make sure you discuss any questions you have with your health care provider.  Medicines During Pregnancy During pregnancy, there are medicines that are either safe or unsafe to take. Medicines include prescriptions from your caregiver, over-the-counter medicines, topical creams applied to the skin, and all herbal substances. Medicines are put into either Class A, B, C, or D. Class A and B medicines have been shown to be safe in pregnancy. Class C medicines are also considered to be safe in pregnancy, but these medicines should only be used when necessary. Class D medicines should not be used at all in pregnancy. They can  be harmful to a baby.  It is best to take as little medicine as possible while pregnant. However, some medicines are necessary to take for the mother and baby's health. Sometimes, it is more dangerous to stop taking certain medicines than to stay on them. This is often the case for people with long-term (chronic) conditions such as asthma, diabetes, or high blood pressure (hypertension). If you are pregnant and have a chronic illness, call your caregiver right away. Bring a list of your medicines and their doses to your appointments. If you are planning to become pregnant, schedule a doctor's appointment and discuss your medicines with your caregiver. Lastly, write down the phone number to your pharmacist. They can answer questions regarding a medicine's class and safety. They cannot give advice as to whether you should or should not be on a medicine.  SAFE AND UNSAFE MEDICINES There is a long list of medicines that are considered safe in pregnancy. Below is a shorter list. For specific medicines, ask your caregiver.  AllergyMedicines Loratadine, cetirizine, and chlorpheniramine are safe to take. Certain nasal steroid sprays are safe. Talk to your caregiver about specific brands that are safe. Analgesics Acetaminophen and acetaminophen with codeine are safe to take. All other nonsteroidal anti-inflammatory drugs (NSAIDS) are not safe. This includes ibuprofen.  Antacids Many over-the-counter antacids are safe to take. Talk to your caregiver about specific brands that are safe. Famotidine, ranitidine, and lansoprazole are safe. Omepresole is considered safe to take in the second trimester. Antibiotic Medicines There are several antibiotics to avoid. These include, but are not limited to, tetracyline, quinolones, and sulfa medications. Talk to your caregiver before taking any antibiotic.  Antihistamines Talk to your caregiver about specific brands that are safe.  Asthma Medicines Most asthma  steroid inhalers are safe to take. Talk to your caregiver for specific details. Calcium Calcium supplements are safe to take. Do not take oyster shell calcium.  Cough and Cold Medicines It is safe to take products with guaifenesin  or dextromethorphan. Talk to your caregiver about specific brands that are safe. It is not safe to take products that contain aspirin or ibuprofen. Decongestant Medicines Pseudoephedrine-containing products are safe to take in the second and third trimester.  Depression Medicines Talk about these medicines with your caregiver.  Antidiarrheal Medicines It is safe to take loperamide. Talk to your caregiver about specific brands that are safe. It is not safe to take any antidiarrheal medicine that contains bismuth. Eyedrops Allergy eyedrops should be limited.  Iron It is safe to use certain iron-containing medicines for anemia in pregnancy. They require a prescription.  Antinausea Medicines It is safe to take doxylamine and vitamin B6 as directed. There are other prescription medicines available, if needed.  Sleep aids It is safe to take diphenhydramine and acetaminophen with diphenhydramine.  Steroids Hydrocortisone creams are safe to use as directed. Oral steroids require a prescription. It is not safe to take any hemorrhoid cream with pramoxine or phenylephrine. Stool softener It is safe to take stool softener medicines. Avoid daily or prolonged use of stool softeners. Thyroid Medicine It is important to stay on this thyroid medicine. It needs to be followed by your caregiver.  Vaginal Medicines Your caregiver will prescribe a medicine to you if you have a vaginal infection. Certain antifungal medicines are safe to use if you have a sexually transmitted infection (STI). Talk to your caregiver.  Document Released: 03/28/2005 Document Revised: 06/20/2011 Document Reviewed: 03/29/2011 [facility] Patient Information 2015 [place], [place]. This information  is not intended to replace advice given to you by your health care provider. Make sure you discuss any questions you have with your health care provider.  Prenatal Care  WHAT IS PRENATAL CARE?  Prenatal care means health care during your pregnancy, before your baby is born. It is very important to take care of yourself and your baby during your pregnancy by:   Getting early prenatal care. If you know you are pregnant, or think you might be pregnant, call your health care provider as soon as possible. Schedule a visit for a prenatal exam.  Getting regular prenatal care. Follow your health care provider's schedule for blood and other necessary tests. Do not miss appointments.  Doing everything you can to keep yourself and your baby healthy during your pregnancy.  Getting complete care. Prenatal care should include evaluation of the medical, dietary, educational, psychological, and social needs of you and your significant other. The medical and genetic history of your family and the family of your baby's father should be discussed with your health care provider.  Discussing with your health care provider:  Prescription, over-the-counter, and herbal medicines that you take.  Any history of substance abuse, alcohol use, smoking, and illegal drug use.  Any history of domestic abuse and violence.  Immunizations you have received.  Your nutrition and diet.  The amount of exercise you do.  Any environmental and occupational hazards to which you are exposed.  History of sexually transmitted infections for both you and your partner.  Previous pregnancies you have had. WHY IS PRENATAL CARE SO IMPORTANT?  By regularly seeing your health care provider, you help ensure that problems can be identified early so that they can be treated as soon as possible. Other problems might be prevented. Many studies have shown that early and regular prenatal care is important for the health of mothers and their  babies.  HOW CAN I TAKE CARE OF MYSELF WHILE I AM PREGNANT?  Here are ways to take  care of yourself and your baby:   Start or continue taking your multivitamin with 400 micrograms (mcg) of folic acid every day.  Get early and regular prenatal care. It is very important to see a health care provider during your pregnancy. Your health care provider will check at each visit to make sure that you and your baby are healthy. If there are any problems, action can be taken right away to help you and your baby.  Eat a healthy diet that includes:  Fruits.  Vegetables.  Foods low in saturated fat.  Whole grains.  Calcium-rich foods, such as milk, yogurt, and hard cheeses.  Drink 6-8 glasses of liquids a day.  Unless your health care provider tells you not to, try to be physically active for 30 minutes, most days of the week. If you are pressed for time, you can get your activity in through 10-minute segments, three times a day.  Do not smoke, drink alcohol, or use drugs. These can cause long-term damage to your baby. Talk with your health care provider about steps to take to stop smoking. Talk with a member of your faith community, a counselor, a trusted friend, or your health care provider if you are concerned about your alcohol or drug use.  Ask your health care provider before taking any medicine, even over-the-counter medicines. Some medicines are not safe to take during pregnancy.  Get plenty of rest and sleep.  Avoid hot tubs and saunas during pregnancy.  Do not have X-rays taken unless absolutely necessary and with the recommendation of your health care provider. A lead shield can be placed on your abdomen to protect your baby when X-rays are taken in other parts of your body.  Do not empty the cat litter when you are pregnant. It may contain a parasite that causes an infection called toxoplasmosis, which can cause birth defects. Also, use gloves when working in garden areas used by  cats.  Do not eat uncooked or undercooked meats or fish.  Do not eat soft, mold-ripened cheeses (Brie, Camembert, and chevre) or soft, blue-veined cheese (Danish blue and Roquefort).  Stay away from toxic chemicals like:  Insecticides.  Solvents (some cleaners or paint thinners).  Lead.  Mercury.  Sexual intercourse may continue until the end of the pregnancy, unless you have a medical problem or there is a problem with the pregnancy and your health care provider tells you not to.  Do not wear high-heel shoes, especially during the second half of the pregnancy. You can lose your balance and fall.  Do not take long trips, unless absolutely necessary. Be sure to see your health care provider before going on the trip.  Do not sit in one position for more than 2 hours when on a trip.  Take a copy of your medical records when going on a trip. Know where a hospital is located in the city you are visiting, in case of an emergency.  Most dangerous household products will have pregnancy warnings on their labels. Ask your health care provider about products if you are unsure.  Limit or eliminate your caffeine intake from coffee, tea, sodas, medicines, and chocolate.  Many women continue working through pregnancy. Staying active might help you stay healthier. If you have a question about the safety or the hours you work at your particular job, talk with your health care provider.  Get informed:  Read books.  Watch videos.  Go to childbirth classes for you and your significant other.  Talk with experienced moms.  Ask your health care provider about childbirth education classes for you and your partner. Classes can help you and your partner prepare for the birth of your baby.  Ask about a baby doctor (pediatrician) and methods and pain medicine for labor, delivery, and possible cesarean delivery. HOW OFTEN SHOULD I SEE MY HEALTH CARE PROVIDER DURING PREGNANCY?  Your health care  provider will give you a schedule for your prenatal visits. You will have visits more often as you get closer to the end of your pregnancy. An average pregnancy lasts about 40 weeks.  A typical schedule includes visiting your health care provider:   About once each month during your first 6 months of pregnancy.  Every 2 weeks during the next 2 months.  Weekly in the last month, until the delivery date. Your health care provider will probably want to see you more often if:  You are older than 35 years.  Your pregnancy is high risk because you have certain health problems or problems with the pregnancy, such as:  Diabetes.  High blood pressure.  The baby is not growing on schedule, according to the dates of the pregnancy. Your health care provider will do special tests to make sure you and your baby are not having any serious problems. WHAT HAPPENS DURING PRENATAL VISITS?   At your first prenatal visit, your health care provider will do a physical exam and talk to you about your health history and the health history of your partner and your family. Your health care provider will be able to tell you what date to expect your baby to be born on.  Your first physical exam will include checks of your blood pressure, measurements of your height and weight, and an exam of your pelvic organs. Your health care provider will do a Pap test if you have not had one recently and will do cultures of your cervix to make sure there is no infection.  At each prenatal visit, there will be tests of your blood, urine, blood pressure, weight, and the progress of the baby will be checked.  At your later prenatal visits, your health care provider will check how you are doing and how your baby is developing. You may have a number of tests done as your pregnancy progresses.  Ultrasound exams are often used to check on your baby's growth and health.  You may have more urine and blood tests, as well as special  tests, if needed. These may include amniocentesis to examine fluid in the pregnancy sac, stress tests to check how the baby responds to contractions, or a biophysical profile to measure your baby's well-being. Your health care provider will explain the tests and why they are necessary.  You should be tested for high blood sugar (gestational diabetes) between the 24th and 28th weeks of your pregnancy.  You should discuss with your health care provider your plans to breastfeed or bottle-feed your baby.  Each visit is also a chance for you to learn about staying healthy during pregnancy and to ask questions. Document Released: 03/31/2003 Document Revised: 04/02/2013 Document Reviewed: 06/12/2013 [facility] Patient Information 2015 [place], [place]. This information is not intended to replace advice given to you by your health care provider. Make sure you discuss any questions you have with your health care provider.

## 2014-01-08 NOTE — ED Notes (Signed)
Per pt sts that she is possibly [redacted] weeks pregnant and cramping. Denies vaginal bleeding or discharge.

## 2014-01-15 ENCOUNTER — Encounter (HOSPITAL_COMMUNITY): Payer: Self-pay

## 2014-01-15 ENCOUNTER — Inpatient Hospital Stay (HOSPITAL_COMMUNITY)
Admission: AD | Admit: 2014-01-15 | Discharge: 2014-01-15 | Disposition: A | Discharge status: Home / Self Care | Payer: Self-pay | Source: Ambulatory Visit | Attending: Obstetrics & Gynecology | Admitting: Obstetrics & Gynecology

## 2014-01-15 DIAGNOSIS — A6000 Herpesviral infection of urogenital system, unspecified: Secondary | ICD-10-CM | POA: Insufficient documentation

## 2014-01-15 DIAGNOSIS — O98811 Other maternal infectious and parasitic diseases complicating pregnancy, first trimester: Secondary | ICD-10-CM | POA: Insufficient documentation

## 2014-01-15 DIAGNOSIS — Z3A01 Less than 8 weeks gestation of pregnancy: Secondary | ICD-10-CM | POA: Insufficient documentation

## 2014-01-15 DIAGNOSIS — B3731 Acute candidiasis of vulva and vagina: Secondary | ICD-10-CM

## 2014-01-15 DIAGNOSIS — B009 Herpesviral infection, unspecified: Secondary | ICD-10-CM

## 2014-01-15 DIAGNOSIS — Z87891 Personal history of nicotine dependence: Secondary | ICD-10-CM | POA: Insufficient documentation

## 2014-01-15 DIAGNOSIS — O98311 Other infections with a predominantly sexual mode of transmission complicating pregnancy, first trimester: Secondary | ICD-10-CM | POA: Insufficient documentation

## 2014-01-15 DIAGNOSIS — B373 Candidiasis of vulva and vagina: Secondary | ICD-10-CM | POA: Insufficient documentation

## 2014-01-15 LAB — URINALYSIS, ROUTINE W REFLEX MICROSCOPIC
Bilirubin Urine: NEGATIVE
Glucose, UA: NEGATIVE mg/dL
Ketones, ur: NEGATIVE mg/dL
Nitrite: NEGATIVE
PROTEIN: NEGATIVE mg/dL
Specific Gravity, Urine: 1.02 (ref 1.005–1.030)
Urobilinogen, UA: 0.2 mg/dL (ref 0.0–1.0)
pH: 6.5 (ref 5.0–8.0)

## 2014-01-15 LAB — URINE MICROSCOPIC-ADD ON

## 2014-01-15 LAB — HIV ANTIBODY (ROUTINE TESTING W REFLEX): HIV: NONREACTIVE

## 2014-01-15 LAB — WET PREP, GENITAL
Clue Cells Wet Prep HPF POC: NONE SEEN
Trich, Wet Prep: NONE SEEN

## 2014-01-15 MED ORDER — FLUCONAZOLE 150 MG PO TABS
150.0000 mg | ORAL_TABLET | Freq: Once | ORAL | Status: AC
  Administered 2014-01-15: 150 mg via ORAL
  Filled 2014-01-15: qty 1

## 2014-01-15 MED ORDER — TERCONAZOLE 0.4 % VA CREA: 1.0000 | TOPICAL_CREAM | Freq: Every day | VAGINAL | Status: DC

## 2014-01-15 MED ORDER — VALACYCLOVIR HCL 1 G PO TABS: 1000.0000 mg | ORAL_TABLET | Freq: Two times a day (BID) | ORAL | Status: AC

## 2014-01-15 NOTE — MAU Note (Signed)
Patient states she has been having lower abdominal and vaginal pain for two days. Denies bleeding or discharge.

## 2014-01-15 NOTE — MAU Provider Note (Signed)
Attestation of Attending Supervision of Advanced Practitioner (CNM/NP): Evaluation and management procedures were performed by the Advanced Practitioner under my supervision and collaboration.  I have reviewed the Advanced Practitioner's note and chart, and I agree with the management and plan.  HARRAWAY-SMITH, Annalise Mcdiarmid 3:56 PM     

## 2014-01-15 NOTE — MAU Note (Signed)
C/o intermittent cramping for past 2 weeks; cramping increased past 2 days and pt couldn't sleep; c/o vaginal burning and pain for past 2 days- pain intense with voiding;

## 2014-01-15 NOTE — MAU Provider Note (Signed)
History     CSN: [id number]  Arrival date and time: 01/15/14 4098   None     Chief Complaint  Patient presents with  . Abdominal Pain  . Vaginal Pain   HPI  [name] is a 23 y.o. female G2P0010 at [redacted]w[redacted]d who presents with lower abdominal cramping that started last Wedsday; in the last few days the pain has worsened.  Denies new sexual partners.  She is also having vaginal pain that feels like someone is ripping her vaginal parts out. The pain is in the inside and outside. She also has a lot of burning when she pees on the outside. "feels like I have a cut on the outside of my vagina."    OB History   Grav Para Term Preterm Abortions TAB SAB Ect Mult Living   2    1 1           Past Medical History  Diagnosis Date  . No pertinent past medical history   . Migraine     Past Surgical History  Procedure Laterality Date  . No past surgeries      Family History  Problem Relation Age of Onset  . Stroke Mother   . Hypertension Father   . Stroke Father   . Diabetes Father     History  Substance Use Topics  . Smoking status: Former Games developer  . Smokeless tobacco: Never Used  . Alcohol Use: Yes    Allergies:  Allergies  Allergen Reactions  . Latex Other (See Comments)    Irritation     Prescriptions prior to admission  Medication Sig Dispense Refill  . Prenatal Vit-Fe Fumarate-FA (PRENATAL MULTIVITAMIN) TABS tablet Take 1 tablet by mouth daily at 12 noon.        Results for orders placed during the hospital encounter of 01/15/14 (from the past 48 hour(s))  URINALYSIS, ROUTINE W REFLEX MICROSCOPIC     Status: Abnormal   Collection Time    01/15/14  8:45 AM      Result Value Ref Range   Color, Urine YELLOW  YELLOW   APPearance CLEAR  CLEAR   Specific Gravity, Urine 1.020  1.005 - 1.030   pH 6.5  5.0 - 8.0   Glucose, UA NEGATIVE  NEGATIVE mg/dL   Hgb urine dipstick TRACE (*) NEGATIVE   Bilirubin Urine NEGATIVE  NEGATIVE   Ketones, ur NEGATIVE   NEGATIVE mg/dL   Protein, ur NEGATIVE  NEGATIVE mg/dL   Urobilinogen, UA 0.2  0.0 - 1.0 mg/dL   Nitrite NEGATIVE  NEGATIVE   Leukocytes, UA SMALL (*) NEGATIVE  URINE MICROSCOPIC-ADD ON     Status: None   Collection Time    01/15/14  8:45 AM      Result Value Ref Range   Squamous Epithelial / LPF RARE  RARE   WBC, UA 3-6  <3 WBC/hpf   RBC / HPF 0-2  <3 RBC/hpf   Urine-Other MUCOUS PRESENT    WET PREP, GENITAL     Status: Abnormal   Collection Time    01/15/14  9:25 AM      Result Value Ref Range   Yeast Wet Prep HPF POC MODERATE (*) NONE SEEN   Trich, Wet Prep NONE SEEN  NONE SEEN   Clue Cells Wet Prep HPF POC NONE SEEN  NONE SEEN   WBC, Wet Prep HPF POC TOO NUMEROUS TO COUNT (*) NONE SEEN   Comment: MANY BACTERIA SEEN  HIV ANTIBODY (ROUTINE TESTING)  Status: None   Collection Time    01/15/14  9:35 AM      Result Value Ref Range   HIV 1&2 Ab, 4th Generation NONREACTIVE  NONREACTIVE   Comment: (NOTE)     A NONREACTIVE HIV Ag/Ab result does not exclude HIV infection since     the time frame for seroconversion is variable. If acute HIV infection     is suspected, a HIV-1 RNA Qualitative TMA test is recommended.     HIV-1/2 Antibody Diff         Not indicated.     HIV-1 RNA, Qual TMA           Not indicated.     PLEASE NOTE: This information has been disclosed to you from records     whose confidentiality may be protected by state law. If your state     requires such protection, then the state law prohibits you from making     any further disclosure of the information without the specific written     consent of the person to whom it pertains, or as otherwise permitted     by law. A general authorization for the release of medical or other     information is NOT sufficient for this purpose.     The performance of this assay has not been clinically validated in     patients less than 56 years old.     Performed at [facility]     CLINICAL DATA: Pregnant, cramping    EXAM:  OBSTETRIC <14 WK [place] AND TRANSVAGINAL OB US  TECHNIQUE:  Both transabdominal and transvaginal ultrasound examinations were  performed for complete evaluation of the gestation as well as the  maternal uterus, adnexal regions, and pelvic cul-de-sac.  Transvaginal technique was performed to assess early pregnancy.  COMPARISON: None.  FINDINGS:  Intrauterine gestational sac: Visualized/normal in shape.  Yolk sac: Present  Embryo: Present  Cardiac Activity: Present  Heart Rate: 88 bpm  CRL: 2.6 mm 5 w 6 d [place] EDC: 09/04/2015  Maternal uterus/adnexae: No subchronic hemorrhage.  Right ovary is within normal limits, measuring 3.1 x 1.6 x 2.2 cm.  Left ovary is within normal limits, measuring 3.2 x 2.1 x 2.0 cm.  Small volume pelvic ascites.  IMPRESSION:  Single live intrauterine gestation, measuring 5 weeks 6 days by  crown-rump length.  Electronically Signed  By: Charline Bills M.D.  On: 01/08/2014 18:56   Review of Systems  Constitutional: Negative for fever and chills.  Gastrointestinal: Positive for abdominal pain. Negative for nausea and vomiting.  Genitourinary: Positive for urgency and frequency. Negative for dysuria and hematuria.       Burning on the outside "when urine touches a scratch that I have on the outside"   Physical Exam   Blood pressure 106/63, pulse 72, temperature 98 F (36.7 C), temperature source Oral, resp. rate 18, height 5' 4.5" (1.638 m), weight 51.483 kg (113 lb 8 oz), last menstrual period 11/29/2013.  Physical Exam  Constitutional: She is oriented to person, place, and time. She appears well-developed and well-nourished. No distress.  HENT:  Head: Normocephalic.  Eyes: Pupils are equal, round, and reactive to light.  Neck: Neck supple.  Respiratory: Effort normal.  GI: Soft. There is generalized tenderness. There is no rigidity, no rebound and no guarding.  Genitourinary:    There is tenderness and lesion on the right labia. Vaginal  discharge found.  Speculum exam: Vagina - Moderate amount of thick, pale  yellow discharge, no odor Cervix - No contact bleeding Bimanual exam: Cervix closed, no CMT  Uterus non tender, normal size Adnexa non tender, no masses bilaterally GC/Chlam, wet prep done, HSV culture done  Chaperone present for exam.   Musculoskeletal: Normal range of motion.  Neurological: She is alert and oriented to person, place, and time.  Skin: Skin is warm. She is not diaphoretic.  Psychiatric: Her behavior is normal.    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

## 2014-01-15 NOTE — MAU Note (Deleted)
C/o intermittent brownish discharge since Sunday; c/o intermittent lower abdominal tightness since Sunday;

## 2014-01-17 LAB — GC/CHLAMYDIA PROBE AMP
CT PROBE, AMP APTIMA: NEGATIVE
GC Probe RNA: NEGATIVE

## 2014-01-17 LAB — HERPES SIMPLEX VIRUS CULTURE
Culture: NOT DETECTED
Special Requests: NORMAL

## 2014-02-10 ENCOUNTER — Encounter (HOSPITAL_COMMUNITY): Payer: Self-pay

## 2014-07-16 ENCOUNTER — Encounter (HOSPITAL_COMMUNITY): Payer: Self-pay | Admitting: *Deleted

## 2014-07-16 ENCOUNTER — Emergency Department (HOSPITAL_COMMUNITY)
Admission: EM | Admit: 2014-07-16 | Discharge: 2014-07-16 | Disposition: A | Discharge status: Home / Self Care | Payer: Self-pay | Attending: Emergency Medicine | Admitting: Emergency Medicine

## 2014-07-16 DIAGNOSIS — Z87891 Personal history of nicotine dependence: Secondary | ICD-10-CM | POA: Insufficient documentation

## 2014-07-16 DIAGNOSIS — L0291 Cutaneous abscess, unspecified: Secondary | ICD-10-CM

## 2014-07-16 DIAGNOSIS — Z8679 Personal history of other diseases of the circulatory system: Secondary | ICD-10-CM | POA: Insufficient documentation

## 2014-07-16 DIAGNOSIS — L02214 Cutaneous abscess of groin: Secondary | ICD-10-CM | POA: Insufficient documentation

## 2014-07-16 DIAGNOSIS — Z91040 Latex allergy status: Secondary | ICD-10-CM | POA: Insufficient documentation

## 2014-07-16 MED ORDER — LIDOCAINE HCL 2 % IJ SOLN
5.0000 mL | Freq: Once | INTRAMUSCULAR | Status: DC
  Filled 2014-07-16: qty 20

## 2014-07-16 NOTE — ED Notes (Signed)
In to d/c patient, unable to find patient. Wound was dressed by NT.

## 2014-07-16 NOTE — ED Notes (Signed)
Pt reports R pelvic abscess-no drainage noted.  Has been using hot packs without relief.

## 2014-07-16 NOTE — Discharge Instructions (Signed)
Abscess An abscess is an infected area that contains a collection of pus and debris.It can occur in almost any part of the body. An abscess is also known as a furuncle or boil. CAUSES  An abscess occurs when tissue gets infected. This can occur from blockage of oil or sweat glands, infection of hair follicles, or a minor injury to the skin. As the body tries to fight the infection, pus collects in the area and creates pressure under the skin. This pressure causes pain. People with weakened immune systems have difficulty fighting infections and get certain abscesses more often.  SYMPTOMS Usually an abscess develops on the skin and becomes a painful mass that is red, warm, and tender. If the abscess forms under the skin, you may feel a moveable soft area under the skin. Some abscesses break open (rupture) on their own, but most will continue to get worse without care. The infection can spread deeper into the body and eventually into the bloodstream, causing you to feel ill.  DIAGNOSIS  Your caregiver will take your medical history and perform a physical exam. A sample of fluid may also be taken from the abscess to determine what is causing your infection. TREATMENT  Your caregiver may prescribe antibiotic medicines to fight the infection. However, taking antibiotics alone usually does not cure an abscess. Your caregiver may need to make a small cut (incision) in the abscess to drain the pus. In some cases, gauze is packed into the abscess to reduce pain and to continue draining the area. HOME CARE INSTRUCTIONS   Only take over-the-counter or prescription medicines for pain, discomfort, or fever as directed by your caregiver.  If you were prescribed antibiotics, take them as directed. Finish them even if you start to feel better.  If gauze is used, follow your caregiver's directions for changing the gauze.  To avoid spreading the infection:  Keep your draining abscess covered with a  bandage.  Wash your hands well.  Do not share personal care items, towels, or whirlpools with others.  Avoid skin contact with others.  Keep your skin and clothes clean around the abscess.  Keep all follow-up appointments as directed by your caregiver. SEEK MEDICAL CARE IF:   You have increased pain, swelling, redness, fluid drainage, or bleeding.  You have muscle aches, chills, or a general ill feeling.  You have a fever. MAKE SURE YOU:   Understand these instructions.  Will watch your condition.  Will get help right away if you are not doing well or get worse. Document Released: 01/05/2005 Document Revised: 09/27/2011 Document Reviewed: 06/10/2011 [facility] Patient Information 2015 [place], [place]. This information is not intended to replace advice given to you by your health care provider. Make sure you discuss any questions you have with your health care provider.  Please keep wound clean and covered. Warm water soaks of the area daily followed by dry sterile breast dressing. Monitor for worsening signs or symptoms of infection including fever, redness, discharge, pain. Please follow-up if any of the above signs or symptoms present.

## 2014-07-16 NOTE — ED Provider Notes (Signed)
CSN: [id number]     Arrival date & time 07/16/14  2039 History   First MD Initiated Contact with Patient 07/16/14 2111     Chief Complaint  Patient presents with  . Abscess     HPI   24 year old female presents today with an abscess to her right groin. Patient reports that 2 days ago she had a bump that she attempt was resolved using warm compresses. She reports a history of the same usually resolve with warm compresses and time, but reports this one continues to grow in his becoming extremely painful. She denies any other abscesses, or history of skin infections. Denies fever, headache chills, abdominal pain, or any additional pain. Patient reports she is otherwise healthy, does not take any medications, and is allergic to latex. No additional complaints in addition to those noted above. Currently taking any medications.  Past Medical History  Diagnosis Date  . No pertinent past medical history   . Migraine    Past Surgical History  Procedure Laterality Date  . No past surgeries     Family History  Problem Relation Age of Onset  . Stroke Mother   . Hypertension Father   . Stroke Father   . Diabetes Father    History  Substance Use Topics  . Smoking status: Former Games developer  . Smokeless tobacco: Never Used  . Alcohol Use: Yes   OB History    Gravida Para Term Preterm AB TAB SAB Ectopic Multiple Living   Review of Systems  All other systems reviewed and are negative.  Allergies  Latex  Home Medications   Prior to Admission medications   Medication Sig Start Date End Date Taking? Authorizing Provider  terconazole (TERAZOL 7) 0.4 % vaginal cream Place 1 applicator vaginally at bedtime. Patient not taking: Reported on 07/16/2014 01/15/14   Duane Lope, NP   BP 106/76 mmHg  Pulse 83  Temp(Src) 98.3 F (36.8 C) (Oral)  Resp 16  SpO2 100%  LMP 10/25/2013  Breastfeeding? Unknown Physical Exam  Constitutional: She is oriented to person, place, and  time. She appears well-developed and well-nourished.  HENT:  Head: Normocephalic and atraumatic.  Eyes: Pupils are equal, round, and reactive to light.  Neck: Normal range of motion. Neck supple. No JVD present. No tracheal deviation present. No thyromegaly present.  Cardiovascular: Normal rate, regular rhythm, normal heart sounds and intact distal pulses.  Exam reveals no gallop and no friction rub.   No murmur heard. Pulmonary/Chest: Effort normal and breath sounds normal. No stridor. No respiratory distress. She has no wheezes. She has no rales. She exhibits no tenderness.  Musculoskeletal: Normal range of motion.  Lymphadenopathy:    She has no cervical adenopathy.  Neurological: She is alert and oriented to person, place, and time. Coordination normal.  Skin: Skin is warm and dry.  1.5 cm fluctuant abscess to right groin. No signs of surrounding infection including, cellulitis, fever, pain masses tender to palpation, no discharge.  Psychiatric: She has a normal mood and affect. Her behavior is normal. Judgment and thought content normal.  Nursing note and vitals reviewed.   ED Course  Procedures (including critical care time)  INCISION AND DRAINAGE Performed by: Thermon Leyland Consent: Verbal consent obtained. Risks and benefits: risks, benefits and alternatives were discussed Type: abscess  Body area: Right groin  Anesthesia: local infiltration  Incision was made with a scalpel.  Local anesthetic: lidocaine  2 %   Anesthetic total: 3 ml  Complexity: complex Blunt dissection to break up loculations  Drainage: purulent  Drainage amount: .5cc Packing material: No packing Patient tolerance: Patient tolerated the procedure well with no immediate complications.  Labs Review Labs Reviewed - No data to display  Imaging Review No results found.   EKG Interpretation None     MDM   Final diagnoses:  Abscess    Assessment/Plan: Abscess. Patient responded  well to I&D, small amount of purulent discharge is noted. She was covered with a sterile dressing. No signs of surrounding cellulitis complicating infection noted, no need for antibiotics at this time. Patient was given warning signs for worsening infection. Patient left the treatment area before discharge instructions could be completed.      Eyvonne MechanicJeffrey Jessenya Berdan, PA-C 07/17/14 [id number]  Raeford RazorStephen Kohut, MD 07/17/14 2015

## 2014-07-20 ENCOUNTER — Inpatient Hospital Stay (HOSPITAL_COMMUNITY)
Admission: AD | Admit: 2014-07-20 | Discharge: 2014-07-20 | Disposition: A | Discharge status: Home / Self Care | Payer: Self-pay | Source: Ambulatory Visit | Attending: Family Medicine | Admitting: Family Medicine

## 2014-07-20 ENCOUNTER — Encounter (HOSPITAL_COMMUNITY): Payer: Self-pay | Admitting: *Deleted

## 2014-07-20 DIAGNOSIS — Z87891 Personal history of nicotine dependence: Secondary | ICD-10-CM | POA: Insufficient documentation

## 2014-07-20 DIAGNOSIS — Z3201 Encounter for pregnancy test, result positive: Secondary | ICD-10-CM

## 2014-07-20 LAB — URINALYSIS, ROUTINE W REFLEX MICROSCOPIC
BILIRUBIN URINE: NEGATIVE
Glucose, UA: NEGATIVE mg/dL
Ketones, ur: NEGATIVE mg/dL
Leukocytes, UA: NEGATIVE
Nitrite: NEGATIVE
PH: 5.5 (ref 5.0–8.0)
Protein, ur: NEGATIVE mg/dL
Specific Gravity, Urine: >1.03 — ABNORMAL HIGH (ref 1.005–1.030)
Urobilinogen, UA: 0.2 mg/dL (ref 0.0–1.0)

## 2014-07-20 LAB — URINE MICROSCOPIC-ADD ON

## 2014-07-20 LAB — POCT PREGNANCY, URINE: PREG TEST UR: POSITIVE — AB

## 2014-07-20 NOTE — MAU Note (Signed)
Pt presents to MAU wanting confirmation of pregnancy. Denies any vaginal bleeding or discharge

## 2014-07-20 NOTE — MAU Provider Note (Signed)
  History     CSN: [id number]  Arrival date and time: 07/20/14 1003   None     Chief Complaint  Patient presents with  . Possible Pregnancy   HPI Kristy Little 24 y.o. 1774w4d  Has done several positive pregnancy tests at home and they were positive.  Came for confirmation.  Denies any pain or bleeding.  No problems with nausea.  OB History    Gravida Para Term Preterm AB TAB SAB Ectopic Multiple Living   3    2 1 1          Past Medical History  Diagnosis Date  . No pertinent past medical history   . Migraine     Past Surgical History  Procedure Laterality Date  . No past surgeries      Family History  Problem Relation Age of Onset  . Stroke Mother   . Hypertension Father   . Stroke Father   . Diabetes Father     History  Substance Use Topics  . Smoking status: Former Games developermoker  . Smokeless tobacco: Never Used  . Alcohol Use: Yes    Allergies:  Allergies  Allergen Reactions  . Latex Other (See Comments)    Irritation     Prescriptions prior to admission  Medication Sig Dispense Refill Last Dose  . terconazole (TERAZOL 7) 0.4 % vaginal cream Place 1 applicator vaginally at bedtime. (Patient not taking: Reported on 07/16/2014) 45 g 0 Completed Course at Unknown time    Review of Systems  Constitutional: Negative for fever.  Gastrointestinal: Negative for nausea, vomiting and abdominal pain.  Genitourinary:       No vaginal discharge. No vaginal bleeding. No dysuria.   Physical Exam   Blood pressure 112/73, pulse 94, temperature 98.2 F (36.8 C), resp. rate 18, height 5\' 5"  (1.651 m), weight 117 lb (53.071 kg), last menstrual period 06/25/2014, unknown if currently breastfeeding.  Physical Exam  Nursing note and vitals reviewed. Constitutional: She is oriented to person, place, and time. She appears well-developed and well-nourished. No distress.  HENT:  Head: Normocephalic.  Eyes: EOM are normal.  Neck: Neck supple.  Musculoskeletal: Normal  range of motion.  Neurological: She is alert and oriented to person, place, and time.  Skin: Skin is warm and dry.  Psychiatric: She has a normal mood and affect.    MAU Course  Procedures  MDM Results for orders placed or performed during the hospital encounter of 07/20/14 (from the past 24 hour(s))  Pregnancy, urine POC     Status: Abnormal   Collection Time: 07/20/14 10:14 AM  Result Value Ref Range   Preg Test, Ur POSITIVE (A) NEGATIVE     Assessment and Plan  Positive pregnancy test  Plan Confirmation letter given. No smoking, no drugs, no alcohol.  Take a prenatal vitamin one by mouth every day.  Eat small frequent snacks to avoid nausea.  Begin prenatal care as soon as possible. List of prenatal providers given. List of medications that can be used in pregnancy given.  Kristy Little 07/20/2014, 10:27 AM

## 2014-07-24 ENCOUNTER — Encounter (HOSPITAL_COMMUNITY): Payer: Self-pay | Admitting: *Deleted

## 2014-07-24 ENCOUNTER — Emergency Department (HOSPITAL_COMMUNITY)
Admission: EM | Admit: 2014-07-24 | Discharge: 2014-07-24 | Disposition: A | Discharge status: Home / Self Care | Payer: Self-pay | Attending: Emergency Medicine | Admitting: Emergency Medicine

## 2014-07-24 ENCOUNTER — Emergency Department (HOSPITAL_COMMUNITY): Payer: Self-pay

## 2014-07-24 DIAGNOSIS — Z91040 Latex allergy status: Secondary | ICD-10-CM | POA: Insufficient documentation

## 2014-07-24 DIAGNOSIS — Z3A01 Less than 8 weeks gestation of pregnancy: Secondary | ICD-10-CM | POA: Insufficient documentation

## 2014-07-24 DIAGNOSIS — Z3490 Encounter for supervision of normal pregnancy, unspecified, unspecified trimester: Secondary | ICD-10-CM

## 2014-07-24 DIAGNOSIS — R109 Unspecified abdominal pain: Secondary | ICD-10-CM

## 2014-07-24 DIAGNOSIS — O99711 Diseases of the skin and subcutaneous tissue complicating pregnancy, first trimester: Secondary | ICD-10-CM | POA: Insufficient documentation

## 2014-07-24 DIAGNOSIS — Z8679 Personal history of other diseases of the circulatory system: Secondary | ICD-10-CM | POA: Insufficient documentation

## 2014-07-24 DIAGNOSIS — Z87891 Personal history of nicotine dependence: Secondary | ICD-10-CM | POA: Insufficient documentation

## 2014-07-24 DIAGNOSIS — Z79899 Other long term (current) drug therapy: Secondary | ICD-10-CM | POA: Insufficient documentation

## 2014-07-24 DIAGNOSIS — L0291 Cutaneous abscess, unspecified: Secondary | ICD-10-CM

## 2014-07-24 DIAGNOSIS — L0231 Cutaneous abscess of buttock: Secondary | ICD-10-CM | POA: Insufficient documentation

## 2014-07-24 LAB — BASIC METABOLIC PANEL
Anion gap: 7 (ref 5–15)
BUN: 14 mg/dL (ref 6–23)
CALCIUM: 9.4 mg/dL (ref 8.4–10.5)
CO2: 28 mmol/L (ref 19–32)
CREATININE: 0.74 mg/dL (ref 0.50–1.10)
Chloride: 101 mmol/L (ref 96–112)
GFR calc Af Amer: >90 mL/min (ref >90)
Glucose, Bld: 87 mg/dL (ref 70–99)
Potassium: 3.7 mmol/L (ref 3.5–5.1)
Sodium: 136 mmol/L (ref 135–145)

## 2014-07-24 LAB — URINALYSIS, ROUTINE W REFLEX MICROSCOPIC
Bilirubin Urine: NEGATIVE
Glucose, UA: NEGATIVE mg/dL
Hgb urine dipstick: NEGATIVE
Ketones, ur: NEGATIVE mg/dL
LEUKOCYTES UA: NEGATIVE
Nitrite: NEGATIVE
PROTEIN: NEGATIVE mg/dL
Specific Gravity, Urine: 1.02 (ref 1.005–1.030)
Urobilinogen, UA: 0.2 mg/dL (ref 0.0–1.0)
pH: 6.5 (ref 5.0–8.0)

## 2014-07-24 LAB — CBC WITH DIFFERENTIAL/PLATELET
BASOS ABS: 0 10*3/uL (ref 0.0–0.1)
BASOS PCT: 0 % (ref 0–1)
Eosinophils Absolute: 0.2 10*3/uL (ref 0.0–0.7)
Eosinophils Relative: 2 % (ref 0–5)
HEMATOCRIT: 36.2 % (ref 36.0–46.0)
Hemoglobin: 12.2 g/dL (ref 12.0–15.0)
Lymphocytes Relative: 22 % (ref 12–46)
Lymphs Abs: 1.9 10*3/uL (ref 0.7–4.0)
MCH: 31.1 pg (ref 26.0–34.0)
MCHC: 33.7 g/dL (ref 30.0–36.0)
MCV: 92.3 fL (ref 78.0–100.0)
MONO ABS: 0.9 10*3/uL (ref 0.1–1.0)
MONOS PCT: 11 % (ref 3–12)
NEUTROS ABS: 5.7 10*3/uL (ref 1.7–7.7)
NEUTROS PCT: 65 % (ref 43–77)
Platelets: 232 10*3/uL (ref 150–400)
RBC: 3.92 MIL/uL (ref 3.87–5.11)
RDW: 12 % (ref 11.5–15.5)
WBC: 8.6 10*3/uL (ref 4.0–10.5)

## 2014-07-24 LAB — WET PREP, GENITAL
CLUE CELLS WET PREP: NONE SEEN
Trich, Wet Prep: NONE SEEN
YEAST WET PREP: NONE SEEN

## 2014-07-24 LAB — HCG, QUANTITATIVE, PREGNANCY: hCG, Beta Chain, Quant, S: 7677 m[IU]/mL — ABNORMAL HIGH (ref <5)

## 2014-07-24 MED ORDER — ACETAMINOPHEN 325 MG PO TABS
650.0000 mg | ORAL_TABLET | Freq: Once | ORAL | Status: AC
  Administered 2014-07-24: 650 mg via ORAL
  Filled 2014-07-24: qty 2

## 2014-07-24 MED ORDER — LIDOCAINE-EPINEPHRINE (PF) 1 %-1:200000 IJ SOLN
INTRAMUSCULAR | Status: AC
  Filled 2014-07-24: qty 10

## 2014-07-24 NOTE — ED Notes (Signed)
[redacted] weeks pregnant, abd pain, no bleeding.  Has an abscess on lt buttock for 2 days.

## 2014-07-24 NOTE — ED Provider Notes (Addendum)
CSN: [id number]     Arrival date & time 07/24/14  1521 History   First MD Initiated Contact with Patient 07/24/14 1643     Chief Complaint  Patient presents with  . Abdominal Pain     (Consider location/radiation/quality/duration/timing/severity/associated sxs/prior Treatment) HPI Comments: Pt here with swelling to left glute x 48 hours--no fever or chills--confirmed pregnancy noted at [facility]--denies vaginal bleeding or d/c--abd pain is rlq x 24 hours--intermittent--dysuria noted--no tx used used pta  Patient is a 24 y.o. female presenting with abdominal pain. The history is provided by the patient.  Abdominal Pain   Past Medical History  Diagnosis Date  . No pertinent past medical history   . Migraine   . Pregnant    Past Surgical History  Procedure Laterality Date  . No past surgeries     Family History  Problem Relation Age of Onset  . Stroke Mother   . Hypertension Father   . Stroke Father   . Diabetes Father    History  Substance Use Topics  . Smoking status: Former Games developermoker  . Smokeless tobacco: Never Used  . Alcohol Use: No   OB History    Gravida Para Term Preterm AB TAB SAB Ectopic Multiple Living   3    2 1 1         Review of Systems  Gastrointestinal: Positive for abdominal pain.  All other systems reviewed and are negative.     Allergies  Latex  Home Medications   Prior to Admission medications   Medication Sig Start Date End Date Taking? Authorizing Provider  Prenatal Vit-Fe Fumarate-FA (PRENATAL PO) Take 1 tablet by mouth daily.   Yes Historical Provider, MD  terconazole (TERAZOL 7) 0.4 % vaginal cream Place 1 applicator vaginally at bedtime. Patient not taking: Reported on 07/16/2014 01/15/14   Duane LopeJennifer I Rasch, NP   BP 107/67 mmHg  Pulse 93  Temp(Src) 99.6 F (37.6 C) (Oral)  Resp 18  Ht 5\' 4"  (1.626 m)  Wt 117 lb (53.071 kg)  BMI 20.07 kg/m2  SpO2 100%  LMP 06/25/2014 Physical Exam  Constitutional: She is oriented to person, place, and  time. She appears well-developed and well-nourished.  Non-toxic appearance. No distress.  HENT:  Head: Normocephalic and atraumatic.  Eyes: Conjunctivae, EOM and lids are normal. Pupils are equal, round, and reactive to light.  Neck: Normal range of motion. Neck supple. No tracheal deviation present. No thyroid mass present.  Cardiovascular: Normal rate, regular rhythm and normal heart sounds.  Exam reveals no gallop.   No murmur heard. Pulmonary/Chest: Effort normal and breath sounds normal. No stridor. No respiratory distress. She has no decreased breath sounds. She has no wheezes. She has no rhonchi. She has no rales.  Abdominal: Soft. Normal appearance and bowel sounds are normal. She exhibits no distension. There is no tenderness. There is no rigidity, no rebound, no guarding and no CVA tenderness.  Genitourinary: No vaginal discharge found.  Musculoskeletal: Normal range of motion. She exhibits no edema or tenderness.       Legs: Neurological: She is alert and oriented to person, place, and time. She has normal strength. No cranial nerve deficit or sensory deficit. GCS eye subscore is 4. GCS verbal subscore is 5. GCS motor subscore is 6.  Skin: Skin is warm and dry. No abrasion and no rash noted.  Psychiatric: She has a normal mood and affect. Her speech is normal and behavior is normal.  Nursing note and vitals reviewed.   ED  Course  INCISION AND DRAINAGE Date/Time: 07/24/2014 5:45 PM Performed by: Lorre Nick Authorized by: Lorre Nick Consent: Verbal consent not obtained. Written consent not obtained. Risks and benefits: risks, benefits and alternatives were discussed Consent given by: patient Type: abscess Body area: lower extremity Location details: left buttock Anesthesia: local infiltration Local anesthetic: lidocaine 1% without epinephrine Anesthetic total: 10 ml Patient sedated: no Scalpel size: 10 Incision type: single straight Complexity: simple Drainage:  purulent Drainage amount: moderate Wound treatment: wound left open Patient tolerance: Patient tolerated the procedure well with no immediate complications   (including critical care time) Labs Review Labs Reviewed  URINE CULTURE  CBC WITH DIFFERENTIAL/PLATELET  BASIC METABOLIC PANEL  HCG, QUANTITATIVE, PREGNANCY  URINALYSIS, ROUTINE W REFLEX MICROSCOPIC    Imaging Review No results found.   EKG Interpretation None      MDM   Final diagnoses:  None    Patient's wet prep noted. Patient's ultrasound shows no evidence of ectopic at this time. Abscess was drained as above. Patient stable for discharge  She has no evidence of a surgical abdomen at this time.  Lorre Nick, MD 07/24/14 0981  Lorre Nick, MD 07/24/14 2033

## 2014-07-24 NOTE — ED Notes (Signed)
Pt states that the abscess on her buttock is hurting and she wants something for pain.

## 2014-07-24 NOTE — Discharge Instructions (Signed)
Abscess An abscess is an infected area that contains a collection of pus and debris.It can occur in almost any part of the body. An abscess is also known as a furuncle or boil. CAUSES  An abscess occurs when tissue gets infected. This can occur from blockage of oil or sweat glands, infection of hair follicles, or a minor injury to the skin. As the body tries to fight the infection, pus collects in the area and creates pressure under the skin. This pressure causes pain. People with weakened immune systems have difficulty fighting infections and get certain abscesses more often.  SYMPTOMS Usually an abscess develops on the skin and becomes a painful mass that is red, warm, and tender. If the abscess forms under the skin, you may feel a moveable soft area under the skin. Some abscesses break open (rupture) on their own, but most will continue to get worse without care. The infection can spread deeper into the body and eventually into the bloodstream, causing you to feel ill.  DIAGNOSIS  Your caregiver will take your medical history and perform a physical exam. A sample of fluid may also be taken from the abscess to determine what is causing your infection. TREATMENT  Your caregiver may prescribe antibiotic medicines to fight the infection. However, taking antibiotics alone usually does not cure an abscess. Your caregiver may need to make a small cut (incision) in the abscess to drain the pus. In some cases, gauze is packed into the abscess to reduce pain and to continue draining the area. HOME CARE INSTRUCTIONS   Only take over-the-counter or prescription medicines for pain, discomfort, or fever as directed by your caregiver.  If you were prescribed antibiotics, take them as directed. Finish them even if you start to feel better.  If gauze is used, follow your caregiver's directions for changing the gauze.  To avoid spreading the infection:  Keep your draining abscess covered with a  bandage.  Wash your hands well.  Do not share personal care items, towels, or whirlpools with others.  Avoid skin contact with others.  Keep your skin and clothes clean around the abscess.  Keep all follow-up appointments as directed by your caregiver. SEEK MEDICAL CARE IF:   You have increased pain, swelling, redness, fluid drainage, or bleeding.  You have muscle aches, chills, or a general ill feeling.  You have a fever. MAKE SURE YOU:   Understand these instructions.  Will watch your condition.  Will get help right away if you are not doing well or get worse. Document Released: 01/05/2005 Document Revised: 09/27/2011 Document Reviewed: 06/10/2011 [facility] Patient Information 2015 [place], [place]. This information is not intended to replace advice given to you by your health care provider. Make sure you discuss any questions you have with your health care provider.  Abscess An abscess is an infected area that contains a collection of pus and debris.It can occur in almost any part of the body. An abscess is also known as a furuncle or boil. CAUSES  An abscess occurs when tissue gets infected. This can occur from blockage of oil or sweat glands, infection of hair follicles, or a minor injury to the skin. As the body tries to fight the infection, pus collects in the area and creates pressure under the skin. This pressure causes pain. People with weakened immune systems have difficulty fighting infections and get certain abscesses more often.  SYMPTOMS Usually an abscess develops on the skin and becomes a painful mass that is red,  warm, and tender. If the abscess forms under the skin, you may feel a moveable soft area under the skin. Some abscesses break open (rupture) on their own, but most will continue to get worse without care. The infection can spread deeper into the body and eventually into the bloodstream, causing you to feel ill.  DIAGNOSIS  Your caregiver will take your  medical history and perform a physical exam. A sample of fluid may also be taken from the abscess to determine what is causing your infection. TREATMENT  Your caregiver may prescribe antibiotic medicines to fight the infection. However, taking antibiotics alone usually does not cure an abscess. Your caregiver may need to make a small cut (incision) in the abscess to drain the pus. In some cases, gauze is packed into the abscess to reduce pain and to continue draining the area. HOME CARE INSTRUCTIONS   Only take over-the-counter or prescription medicines for pain, discomfort, or fever as directed by your caregiver.  If you were prescribed antibiotics, take them as directed. Finish them even if you start to feel better.  If gauze is used, follow your caregiver's directions for changing the gauze.  To avoid spreading the infection:  Keep your draining abscess covered with a bandage.  Wash your hands well.  Do not share personal care items, towels, or whirlpools with others.  Avoid skin contact with others.  Keep your skin and clothes clean around the abscess.  Keep all follow-up appointments as directed by your caregiver. SEEK MEDICAL CARE IF:   You have increased pain, swelling, redness, fluid drainage, or bleeding.  You have muscle aches, chills, or a general ill feeling.  You have a fever. MAKE SURE YOU:   Understand these instructions.  Will watch your condition.  Will get help right away if you are not doing well or get worse. Document Released: 01/05/2005 Document Revised: 09/27/2011 Document Reviewed: 06/10/2011 [facility] Patient Information 2015 [place], [place]. This information is not intended to replace advice given to you by your health care provider. Make sure you discuss any questions you have with your health care provider. Abdominal Pain During Pregnancy Abdominal pain is common in pregnancy. Most of the time, it does not cause harm. There are many causes of  abdominal pain. Some causes are more serious than others. Some of the causes of abdominal pain in pregnancy are easily diagnosed. Occasionally, the diagnosis takes time to understand. Other times, the cause is not determined. Abdominal pain can be a sign that something is very wrong with the pregnancy, or the pain may have nothing to do with the pregnancy at all. For this reason, always tell your health care provider if you have any abdominal discomfort. HOME CARE INSTRUCTIONS  Monitor your abdominal pain for any changes. The following actions may help to alleviate any discomfort you are experiencing:  Do not have sexual intercourse or put anything in your vagina until your symptoms go away completely.  Get plenty of rest until your pain improves.  Drink clear fluids if you feel nauseous. Avoid solid food as long as you are uncomfortable or nauseous.  Only take over-the-counter or prescription medicine as directed by your health care provider.  Keep all follow-up appointments with your health care provider. SEEK IMMEDIATE MEDICAL CARE IF:  You are bleeding, leaking fluid, or passing tissue from the vagina.  You have increasing pain or cramping.  You have persistent vomiting.  You have painful or bloody urination.  You have a fever.  You notice  a decrease in your baby's movements.  You have extreme weakness or feel faint.  You have shortness of breath, with or without abdominal pain.  You develop a severe headache with abdominal pain.  You have abnormal vaginal discharge with abdominal pain.  You have persistent diarrhea.  You have abdominal pain that continues even after rest, or gets worse. MAKE SURE YOU:   Understand these instructions.  Will watch your condition.  Will get help right away if you are not doing well or get worse. Document Released: 03/28/2005 Document Revised: 01/16/2013 Document Reviewed: 10/25/2012 [facility] Patient Information 2015 [place], [place].  This information is not intended to replace advice given to you by your health care provider. Make sure you discuss any questions you have with your health care provider.

## 2014-07-25 LAB — URINE CULTURE
COLONY COUNT: NO GROWTH
Culture: NO GROWTH

## 2014-07-25 LAB — GC/CHLAMYDIA PROBE AMP (~~LOC~~) NOT AT ARMC
Chlamydia: NEGATIVE
NEISSERIA GONORRHEA: NEGATIVE

## 2014-07-25 LAB — HIV ANTIBODY (ROUTINE TESTING W REFLEX): HIV Screen 4th Generation wRfx: NONREACTIVE

## 2014-08-08 ENCOUNTER — Encounter (HOSPITAL_COMMUNITY): Payer: Self-pay

## 2014-08-08 ENCOUNTER — Emergency Department (HOSPITAL_COMMUNITY)
Admission: EM | Admit: 2014-08-08 | Discharge: 2014-08-08 | Discharge status: Against Medical Advice | Payer: Self-pay | Attending: Emergency Medicine | Admitting: Emergency Medicine

## 2014-08-08 ENCOUNTER — Encounter (HOSPITAL_COMMUNITY): Payer: Self-pay | Admitting: *Deleted

## 2014-08-08 ENCOUNTER — Emergency Department (HOSPITAL_COMMUNITY): Payer: Self-pay

## 2014-08-08 ENCOUNTER — Inpatient Hospital Stay (HOSPITAL_COMMUNITY)
Admission: AD | Admit: 2014-08-08 | Discharge: 2014-08-08 | Disposition: A | Discharge status: Home / Self Care | Payer: Self-pay | Source: Ambulatory Visit | Attending: Obstetrics and Gynecology | Admitting: Obstetrics and Gynecology

## 2014-08-08 DIAGNOSIS — O9989 Other specified diseases and conditions complicating pregnancy, childbirth and the puerperium: Secondary | ICD-10-CM | POA: Insufficient documentation

## 2014-08-08 DIAGNOSIS — O26899 Other specified pregnancy related conditions, unspecified trimester: Secondary | ICD-10-CM

## 2014-08-08 DIAGNOSIS — Z3A08 8 weeks gestation of pregnancy: Secondary | ICD-10-CM | POA: Insufficient documentation

## 2014-08-08 DIAGNOSIS — R51 Headache: Secondary | ICD-10-CM | POA: Insufficient documentation

## 2014-08-08 DIAGNOSIS — O99411 Diseases of the circulatory system complicating pregnancy, first trimester: Secondary | ICD-10-CM | POA: Insufficient documentation

## 2014-08-08 DIAGNOSIS — Z87891 Personal history of nicotine dependence: Secondary | ICD-10-CM | POA: Insufficient documentation

## 2014-08-08 DIAGNOSIS — R109 Unspecified abdominal pain: Secondary | ICD-10-CM | POA: Insufficient documentation

## 2014-08-08 DIAGNOSIS — O021 Missed abortion: Secondary | ICD-10-CM | POA: Insufficient documentation

## 2014-08-08 DIAGNOSIS — G43909 Migraine, unspecified, not intractable, without status migrainosus: Secondary | ICD-10-CM | POA: Insufficient documentation

## 2014-08-08 DIAGNOSIS — R1030 Lower abdominal pain, unspecified: Secondary | ICD-10-CM | POA: Insufficient documentation

## 2014-08-08 LAB — CBC WITH DIFFERENTIAL/PLATELET
BASOS PCT: 0 % (ref 0–1)
Basophils Absolute: 0 10*3/uL (ref 0.0–0.1)
EOS ABS: 0.1 10*3/uL (ref 0.0–0.7)
EOS PCT: 2 % (ref 0–5)
HEMATOCRIT: 33.7 % — AB (ref 36.0–46.0)
Hemoglobin: 11.8 g/dL — ABNORMAL LOW (ref 12.0–15.0)
LYMPHS PCT: 43 % (ref 12–46)
Lymphs Abs: 2.5 10*3/uL (ref 0.7–4.0)
MCH: 31.6 pg (ref 26.0–34.0)
MCHC: 35 g/dL (ref 30.0–36.0)
MCV: 90.1 fL (ref 78.0–100.0)
MONO ABS: 0.5 10*3/uL (ref 0.1–1.0)
MONOS PCT: 9 % (ref 3–12)
NEUTROS PCT: 46 % (ref 43–77)
Neutro Abs: 2.7 10*3/uL (ref 1.7–7.7)
Platelets: 226 10*3/uL (ref 150–400)
RBC: 3.74 MIL/uL — ABNORMAL LOW (ref 3.87–5.11)
RDW: 11.7 % (ref 11.5–15.5)
WBC: 5.8 10*3/uL (ref 4.0–10.5)

## 2014-08-08 LAB — URINALYSIS, ROUTINE W REFLEX MICROSCOPIC
BILIRUBIN URINE: NEGATIVE
Glucose, UA: NEGATIVE mg/dL
Hgb urine dipstick: NEGATIVE
Ketones, ur: NEGATIVE mg/dL
Leukocytes, UA: NEGATIVE
Nitrite: NEGATIVE
Protein, ur: NEGATIVE mg/dL
Specific Gravity, Urine: 1.003 — ABNORMAL LOW (ref 1.005–1.030)
Urobilinogen, UA: 0.2 mg/dL (ref 0.0–1.0)
pH: 7 (ref 5.0–8.0)

## 2014-08-08 LAB — POC URINE PREG, ED: PREG TEST UR: POSITIVE — AB

## 2014-08-08 LAB — ABO/RH: ABO/RH(D): B POS

## 2014-08-08 LAB — HCG, QUANTITATIVE, PREGNANCY: hCG, Beta Chain, Quant, S: 52964 m[IU]/mL — ABNORMAL HIGH (ref <5)

## 2014-08-08 MED ORDER — MISOPROSTOL 200 MCG PO TABS: ORAL_TABLET | ORAL | Status: DC

## 2014-08-08 MED ORDER — OXYCODONE-ACETAMINOPHEN 5-325 MG PO TABS: 1.0000 | ORAL_TABLET | Freq: Four times a day (QID) | ORAL | Status: DC | PRN

## 2014-08-08 MED ORDER — IBUPROFEN 600 MG PO TABS: 600.0000 mg | ORAL_TABLET | Freq: Four times a day (QID) | ORAL | Status: DC | PRN

## 2014-08-08 MED ORDER — BUTALBITAL-APAP-CAFFEINE 50-325-40 MG PO TABS
2.0000 | ORAL_TABLET | Freq: Once | ORAL | Status: AC
  Administered 2014-08-08: 2 via ORAL
  Filled 2014-08-08: qty 2

## 2014-08-08 MED ORDER — PROMETHAZINE HCL 12.5 MG PO TABS: 12.5000 mg | ORAL_TABLET | Freq: Four times a day (QID) | ORAL | Status: DC | PRN

## 2014-08-08 NOTE — MAU Note (Signed)
Pt. Urine in lab 

## 2014-08-08 NOTE — Progress Notes (Signed)
Spoke with patient about her loss, offered emotional support, patient tearful.

## 2014-08-08 NOTE — ED Notes (Signed)
Pt states she is [redacted] weeks pregnant and is having abdominal cramping.  She miscarried at 7 weeks before and was feeling the same pain.  So she's worried.  Denies bleeding, though c/o frequent urination.  Also c/o headache x 3 days that's not relieved by tylenol.

## 2014-08-08 NOTE — ED Notes (Signed)
Trying to pull pt to room unable to [facility]

## 2014-08-08 NOTE — MAU Note (Signed)
Pt states here for lower abd pain in middle of lower abdomen. [facility]. Feels intermittent period cramping. Denies abnormal vaginal discharge.

## 2014-08-08 NOTE — MAU Provider Note (Signed)
History     CSN: [id number]  Arrival date and time: 08/08/14 1556   First Provider Initiated Contact with Patient 08/08/14 1631      No chief complaint on file.  HPI  [name] is a 24 y.o. G3P0020 at [id number] by early US who presents to MAU today with complaint of lower abdominal pain at midline. She states pain x 2 days. She states cramping in nature. She rates pain at 6/10 now. She tried Tylenol without relief. She denies vaginal bleeding, discharge, UTI symptoms, fever or N/V/D.   Patient also states headache x 3 days. She states history of migraines. She states associated photophobia. She denies N/V. She has tried Tylenol without relief.   OB History    Gravida Para Term Preterm AB TAB SAB Ectopic Multiple Living   3    2 1 1          Past Medical History  Diagnosis Date  . No pertinent past medical history   . Migraine   . Pregnant     Past Surgical History  Procedure Laterality Date  . No past surgeries      Family History  Problem Relation Age of Onset  . Stroke Mother   . Hypertension Father   . Stroke Father   . Diabetes Father     History  Substance Use Topics  . Smoking status: Former Games developermoker  . Smokeless tobacco: Never Used  . Alcohol Use: No    Allergies:  Allergies  Allergen Reactions  . Latex Other (See Comments)    Irritation     No prescriptions prior to admission    Review of Systems  Constitutional: Negative for fever and malaise/fatigue.  Eyes: Positive for photophobia.  Gastrointestinal: Positive for abdominal pain. Negative for nausea, vomiting, diarrhea and constipation.  Genitourinary: Negative for dysuria, urgency and frequency.       Neg - vaginal bleeding, discharge  Neurological: Positive for headaches.   Physical Exam   Blood pressure 104/59, pulse 86, temperature 98.2 F (36.8 C), temperature source Oral, resp. rate 18, height 5\' 4"  (1.626 m), weight 118 lb 4 oz (53.638 kg), last menstrual period 06/25/2014,  not currently breastfeeding.  Physical Exam  Nursing note and vitals reviewed. Constitutional: She is oriented to person, place, and time. She appears well-developed and well-nourished. No distress.  HENT:  Head: Normocephalic and atraumatic.  Cardiovascular: Normal rate.   Respiratory: Effort normal.  GI: Soft. She exhibits no distension and no mass. There is no tenderness. There is no rebound and no guarding.  Neurological: She is alert and oriented to person, place, and time.  Skin: Skin is warm and dry. No erythema.  Psychiatric: She has a normal mood and affect.   Results for orders placed or performed during the hospital encounter of 08/08/14 (from the past 24 hour(s))  CBC with Differential/Platelet     Status: Abnormal   Collection Time: 08/08/14  6:25 PM  Result Value Ref Range   WBC 5.8 4.0 - 10.5 K/uL   RBC 3.74 (L) 3.87 - 5.11 MIL/uL   Hemoglobin 11.8 (L) 12.0 - 15.0 g/dL   HCT 13.033.7 (L) 86.536.0 - 78.446.0 %   MCV 90.1 78.0 - 100.0 fL   MCH 31.6 26.0 - 34.0 pg   MCHC 35.0 30.0 - 36.0 g/dL   RDW 69.611.7 29.511.5 - 28.415.5 %   Platelets 226 150 - 400 K/uL   Neutrophils Relative % 46 43 - 77 %   Neutro Abs  2.7 1.7 - 7.7 K/uL   Lymphocytes Relative 43 12 - 46 %   Lymphs Abs 2.5 0.7 - 4.0 K/uL   Monocytes Relative 9 3 - 12 %   Monocytes Absolute 0.5 0.1 - 1.0 K/uL   Eosinophils Relative 2 0 - 5 %   Eosinophils Absolute 0.1 0.0 - 0.7 K/uL   Basophils Relative 0 0 - 1 %   Basophils Absolute 0.0 0.0 - 0.1 K/uL  hCG, quantitative, pregnancy     Status: Abnormal   Collection Time: 08/08/14  6:25 PM  Result Value Ref Range   hCG, Beta Chain, Quant, S 91478 (H) <5 mIU/mL  ABO/Rh     Status: None   Collection Time: 08/08/14  6:25 PM  Result Value Ref Range   ABO/RH(D) B POS    US Ob Transvaginal  08/08/2014   CLINICAL DATA:  Abdominal pain and cramping affecting pregnancy.  EXAM: TRANSVAGINAL OB ULTRASOUND  TECHNIQUE: Transvaginal ultrasound was performed for complete evaluation of the  gestation as well as the maternal uterus, adnexal regions, and pelvic cul-de-sac.  COMPARISON:  07/24/2014  FINDINGS: Intrauterine gestational sac: Visualized/ irregular in shape.  Yolk sac:  Not visualized  Embryo:  Not visualized  MSD: 16  mm   6 w   4  d  Maternal uterus/adnexae: Small subchorionic hemorrhage is now visualized. Both ovaries are normal in appearance. No adnexal mass or free fluid identified.  IMPRESSION: Findings meet definitive criteria for failed pregnancy. This follows SRU consensus guidelines: Diagnostic Criteria for Nonviable Pregnancy Early in the First Trimester. [facility] J [facility] [phone].   Electronically Signed   By: Myles Rosenthal M.D.   On: 08/08/2014 17:59    MAU Course  Procedures None  MDM + UPT recently UA and [place] today 2 Fioricet given US shows finding definitive for failed pregnancy. CBC, ABO/Rh and quant hCG ordered  Early Intrauterine Pregnancy Failure  _x__  Documented intrauterine pregnancy failure less than or equal to [redacted] weeks gestation  _x__  No serious current illness  _x__  Baseline Hgb greater than or equal to 10g/dl  _x__  Patient has easily accessible transportation to the hospital  _x__  Clear preference  _x__  Practitioner/physician deems patient reliable  _x__  Counseling by practitioner or physician  _x__  Patient education by RN  _n/a__  Consent form signed  _n/a__  Rho-Gam given by RN if indicated  _x__ Medication dispensed   _x__   Cytotec 800 mcg  _x_   Intravaginally by patient at home         __   Intravaginally by RN in MAU        __   Rectally by patient at home        __   Rectally by RN in MAU  _x__  Ibuprofen 600 mg 1 tablet by mouth every 6 hours as needed #30  _x__  Hydrocodone/acetaminophen 5/325 mg by mouth every 4 to 6 hours as needed  _x__  Phenergan 12.5 mg by mouth every 4 hours as needed for nausea    Assessment and Plan  A: Missed AB  P: Discharge home Rx for Cytotec, Percocet, Ibuprofen  and Phenergan Bleeding precautions discussed Patient advised to follow-up with [facility] in ~ 2 weeks Patient may return to MAU as needed or if her condition were to change or worsen   Marny Lowenstein, PA-C  08/08/2014, 9:33 PM

## 2014-08-11 ENCOUNTER — Inpatient Hospital Stay (HOSPITAL_COMMUNITY)
Admission: AD | Admit: 2014-08-11 | Discharge: 2014-08-11 | Disposition: A | Discharge status: Home / Self Care | Payer: Self-pay | Source: Ambulatory Visit | Attending: Obstetrics & Gynecology | Admitting: Obstetrics & Gynecology

## 2014-08-11 ENCOUNTER — Encounter (HOSPITAL_COMMUNITY): Payer: Self-pay | Admitting: *Deleted

## 2014-08-11 DIAGNOSIS — O021 Missed abortion: Secondary | ICD-10-CM | POA: Insufficient documentation

## 2014-08-11 MED ORDER — MISOPROSTOL 200 MCG PO TABS
800.0000 ug | ORAL_TABLET | Freq: Once | ORAL | Status: AC
Start: 2014-08-11 — End: 2014-08-11
  Administered 2014-08-11: 800 ug via VAGINAL
  Filled 2014-08-11: qty 4

## 2014-08-11 MED ORDER — PROMETHAZINE HCL 25 MG PO TABS
25.0000 mg | ORAL_TABLET | Freq: Once | ORAL | Status: AC
  Administered 2014-08-11: 25 mg via ORAL
  Filled 2014-08-11: qty 1

## 2014-08-11 MED ORDER — KETOROLAC TROMETHAMINE 60 MG/2ML IM SOLN
60.0000 mg | Freq: Once | INTRAMUSCULAR | Status: AC
  Administered 2014-08-11: 60 mg via INTRAMUSCULAR
  Filled 2014-08-11: qty 2

## 2014-08-11 MED ORDER — HYDROCODONE-ACETAMINOPHEN 5-325 MG PO TABS
1.0000 | ORAL_TABLET | Freq: Once | ORAL | Status: AC
  Administered 2014-08-11: 1 via ORAL
  Filled 2014-08-11: qty 1

## 2014-08-11 NOTE — MAU Note (Signed)
Pt presents to MAU with complaints of abdominal cramping. Denies any vaginal bleeding. Pt was given cytotec for a failed pregnancy on 08/08/14 and didn't get the prescription filled. Presents here today for an increase in abdominal cramping.

## 2014-08-11 NOTE — MAU Provider Note (Signed)
Subjective:  Ms. Richelle ItoKeaunna D Kuenzi is a 24 y.o. female G4P0030 who presents with SAB. She was seen on 4/29 and was told her baby did not grow. She was offered cytotec and was given multiple prescriptions. The patient is here because she wants to talk to someone about the loss. She was unable to take the cytotec because she was so upset at the time of her last visit that she thought she may have heard the news wrong.   She denies vaginal bleeding She attests to sharp, bilateral lower abdominal pain similar to menstrual cramping. She currently rates her pain 5/10.  She has not tried anything for pain and has not picked up her prescription for percocet.    Objective:  GENERAL: Well-developed, well-nourished female in no acute distress.  LUNGS: Effort normal SKIN: Warm, dry and without erythema PSYCH: Normal mood and affect  Filed Vitals:   08/11/14 1452  BP: 100/53  Pulse: 75  Temp: 98.5 F (36.9 C)  Resp: 18   [place] Ob Transvaginal  08/08/2014   CLINICAL DATA:  Abdominal pain and cramping affecting pregnancy.  EXAM: TRANSVAGINAL OB ULTRASOUND  TECHNIQUE: Transvaginal ultrasound was performed for complete evaluation of the gestation as well as the maternal uterus, adnexal regions, and pelvic cul-de-sac.  COMPARISON:  07/24/2014  FINDINGS: Intrauterine gestational sac: Visualized/ irregular in shape.  Yolk sac:  Not visualized  Embryo:  Not visualized  MSD: 16  mm   6 w   4  d  Maternal uterus/adnexae: Small subchorionic hemorrhage is now visualized. Both ovaries are normal in appearance. No adnexal mass or free fluid identified.  IMPRESSION: Findings meet definitive criteria for failed pregnancy. This follows SRU consensus guidelines: Diagnostic Criteria for Nonviable Pregnancy Early in the First Trimester. Macy Mis Engl J Med [phone].   Electronically Signed   By: Myles RosenthalJohn  Stahl M.D.   On: 08/08/2014 17:59      MDM:  Discussed US results at length with the patient. Offered cytotec> Offered  for NP to administer> patient agreed and signed consent Phenergan 25 mg PO Vicodin 1 tab PO Cytotec 800 mcg per vagina placed by myself.  Toradol 60 mg Patient kept for 30 mins following vaginal cytotec; pain improved   Assessment:  1. Missed abortion     Plan:  Discharge home in stable condition Bleeding precautions Patient advised to follow-up with [facility] in ~ 2 weeks; message sent to the clinic Ok to take ibuprofen as directed on the bottle Support given     Duane LopeJennifer I Rasch, NP. 08/11/2014 3:56 PM

## 2014-08-22 ENCOUNTER — Encounter: Payer: Self-pay | Admitting: Obstetrics & Gynecology

## 2014-08-22 ENCOUNTER — Ambulatory Visit (INDEPENDENT_AMBULATORY_CARE_PROVIDER_SITE_OTHER): Payer: Self-pay | Admitting: Obstetrics & Gynecology

## 2014-08-22 VITALS — BP 110/73 | HR 72 | Temp 97.5°F | Wt 117.8 lb

## 2014-08-22 DIAGNOSIS — O039 Complete or unspecified spontaneous abortion without complication: Secondary | ICD-10-CM

## 2014-08-22 MED ORDER — MISOPROSTOL 200 MCG PO TABS: 800.0000 ug | ORAL_TABLET | Freq: Once | ORAL | Status: DC

## 2014-08-22 NOTE — Patient Instructions (Signed)
Incomplete Miscarriage A miscarriage is the sudden loss of an unborn baby (fetus) before the 20th week of pregnancy. In an incomplete miscarriage, parts of the fetus or placenta (afterbirth) remain in the body.  Having a miscarriage can be an emotional experience. Talk with your health care provider about any questions you may have about miscarrying, the grieving process, and your future pregnancy plans. CAUSES   Problems with the fetal chromosomes that make it impossible for the baby to develop normally. Problems with the baby's genes or chromosomes are most often the result of errors that occur by chance as the embryo divides and grows. The problems are not inherited from the parents.  Infection of the cervix or uterus.  Hormone problems.  Problems with the cervix, such as having an incompetent cervix. This is when the tissue in the cervix is not strong enough to hold the pregnancy.  Problems with the uterus, such as an abnormally shaped uterus, uterine fibroids, or congenital abnormalities.  Certain medical conditions.  Smoking, drinking alcohol, or taking illegal drugs.  Trauma. SYMPTOMS   Vaginal bleeding or spotting, with or without cramps or pain.  Pain or cramping in the abdomen or lower back.  Passing fluid, tissue, or blood clots from the vagina. DIAGNOSIS  Your health care provider will perform a physical exam. You may also have an ultrasound to confirm the miscarriage. Blood or urine tests may also be ordered. TREATMENT   Usually, a dilation and curettage (D&C) procedure is performed. During a D&C procedure, the cervix is widened (dilated) and any remaining fetal or placental tissue is gently removed from the uterus.  Antibiotic medicines are prescribed if there is an infection. Other medicines may be given to reduce the size of the uterus (contract) if there is a lot of bleeding.  If you have Rh negative blood and your baby was Rh positive, you will need a Rho (D)  immune globulin shot. This shot will protect any future baby from having Rh blood problems in future pregnancies.  You may be confined to bed rest. This means you should stay in bed and only get up to use the bathroom. HOME CARE INSTRUCTIONS   Rest as directed by your health care provider.  Restrict activity as directed by your health care provider. You may be allowed to continue light activity if curettage was not done but you require further treatment.  Keep track of the number of pads you use each day. Keep track of how soaked (saturated) they are. Record this information.  Do not  use tampons.  Do not douche or have sexual intercourse until approved by your health care provider.  Keep all follow-up appointments for reevaluation and continuing management.  Only take over-the-counter or prescription medicines for pain, fever, or discomfort as directed by your health care provider.  Take antibiotic medicine as directed by your health care provider. Make sure you finish it even if you start to feel better. SEEK IMMEDIATE MEDICAL CARE IF:   You experience severe cramps in your stomach, back, or abdomen.  You have an unexplained temperature (make sure to record these temperatures).  You pass large clots or tissue (save these for your health care provider to inspect).  Your bleeding increases.  You become light-headed, weak, or have fainting episodes. MAKE SURE YOU:   Understand these instructions.  Will watch your condition.  Will get help right away if you are not doing well or get worse. Document Released: 03/28/2005 Document Revised: 08/12/2013 Document Reviewed:   10/25/2012 [employer] Patient Information 2015 [employer]. This information is not intended to replace advice given to you by your health care provider. Make sure you discuss any questions you have with your health care provider.  

## 2014-08-22 NOTE — Progress Notes (Signed)
Follow up U/S scheduled for 08/25/14 at 0830.

## 2014-08-22 NOTE — Progress Notes (Signed)
Subjective:     Patient ID: Kristy Little, female   DOB: 1990-08-31, 24 y.o.   MRN: [id number]  HPI Pt for f/u of SAB.  She is s/p cytotec x 2.  She reports that after the 1st cytotec given in the MAU she had almost no bleeding.  She reports only minimal bleeding after the 2nd dose and expected more.  She denies clots.  She had pain/cramping after the the 2nd dose which has resolved.  She wants to avoid a D&E if possible.   Review of Systems     Objective:   Physical Exam BP 110/73 mmHg  Pulse 72  Temp(Src) 97.5 F (36.4 C) (Oral)  Wt 117 lb 12.8 oz (53.434 kg)  LMP 06/19/2014  Exam deferred     Assessment:     Incomplete SAB- given history of bleeding it is difficult to determine if bleeding caused emptying of the uterus.  Pt does not  want surgery unless absolutely necessary.  Plan:     TV sono to check for retained POC Repeat Cytotec 800mcg per vagina  F/u prn

## 2014-08-25 ENCOUNTER — Ambulatory Visit (HOSPITAL_COMMUNITY)
Admission: RE | Admit: 2014-08-25 | Discharge: 2014-08-25 | Disposition: A | Discharge status: Home / Self Care | Payer: Self-pay | Source: Ambulatory Visit | Attending: Obstetrics & Gynecology | Admitting: Obstetrics & Gynecology

## 2014-08-25 DIAGNOSIS — O039 Complete or unspecified spontaneous abortion without complication: Secondary | ICD-10-CM | POA: Insufficient documentation

## 2014-08-26 ENCOUNTER — Telehealth: Payer: Self-pay

## 2014-08-26 NOTE — Telephone Encounter (Signed)
-----   Message from Kristy Rosenthalarolyn Harraway-Smith, MD sent at 08/26/2014 12:11 PM EDT ----- Please call pt.  Her sono was normal.  No evidence of retained products of conception (POC).  clh-S

## 2014-09-12 NOTE — Telephone Encounter (Signed)
Called pt and LM that her US results are normal and if she has any questions to please give us a call back.

## 2014-11-03 ENCOUNTER — Emergency Department (HOSPITAL_COMMUNITY)
Admission: EM | Admit: 2014-11-03 | Discharge: 2014-11-03 | Discharge status: Against Medical Advice | Payer: Self-pay | Attending: Emergency Medicine | Admitting: Emergency Medicine

## 2014-11-03 ENCOUNTER — Encounter (HOSPITAL_COMMUNITY): Payer: Self-pay | Admitting: Family Medicine

## 2014-11-03 DIAGNOSIS — Z87891 Personal history of nicotine dependence: Secondary | ICD-10-CM | POA: Insufficient documentation

## 2014-11-03 DIAGNOSIS — G43909 Migraine, unspecified, not intractable, without status migrainosus: Secondary | ICD-10-CM | POA: Insufficient documentation

## 2014-11-03 NOTE — ED Notes (Signed)
Pt here for migraine x 4 days. sts hx of same. sts nausea and dizzy

## 2015-08-10 ENCOUNTER — Encounter: Payer: Self-pay | Admitting: Family Medicine

## 2015-08-10 LAB — POCT PREGNANCY, URINE: PREG TEST UR: POSITIVE — AB

## 2015-08-21 ENCOUNTER — Encounter (HOSPITAL_COMMUNITY): Payer: Self-pay | Admitting: *Deleted

## 2015-08-21 ENCOUNTER — Inpatient Hospital Stay (HOSPITAL_COMMUNITY): Payer: Medicaid Other

## 2015-08-21 ENCOUNTER — Inpatient Hospital Stay (HOSPITAL_COMMUNITY)
Admission: AD | Admit: 2015-08-21 | Discharge: 2015-08-21 | Disposition: A | Discharge status: Home / Self Care | Payer: Medicaid Other | Source: Ambulatory Visit | Attending: Obstetrics & Gynecology | Admitting: Obstetrics & Gynecology

## 2015-08-21 DIAGNOSIS — M545 Low back pain: Secondary | ICD-10-CM | POA: Diagnosis not present

## 2015-08-21 DIAGNOSIS — R102 Pelvic and perineal pain: Secondary | ICD-10-CM | POA: Diagnosis not present

## 2015-08-21 DIAGNOSIS — R109 Unspecified abdominal pain: Secondary | ICD-10-CM

## 2015-08-21 DIAGNOSIS — Z3A01 Less than 8 weeks gestation of pregnancy: Secondary | ICD-10-CM | POA: Diagnosis not present

## 2015-08-21 DIAGNOSIS — O26891 Other specified pregnancy related conditions, first trimester: Secondary | ICD-10-CM | POA: Diagnosis not present

## 2015-08-21 DIAGNOSIS — O9989 Other specified diseases and conditions complicating pregnancy, childbirth and the puerperium: Secondary | ICD-10-CM

## 2015-08-21 DIAGNOSIS — Z87891 Personal history of nicotine dependence: Secondary | ICD-10-CM | POA: Diagnosis not present

## 2015-08-21 DIAGNOSIS — O26899 Other specified pregnancy related conditions, unspecified trimester: Secondary | ICD-10-CM

## 2015-08-21 HISTORY — DX: Unspecified infectious disease: B99.9

## 2015-08-21 LAB — CBC
HCT: 34.4 % — ABNORMAL LOW (ref 36.0–46.0)
Hemoglobin: 12 g/dL (ref 12.0–15.0)
MCH: 31 pg (ref 26.0–34.0)
MCHC: 34.9 g/dL (ref 30.0–36.0)
MCV: 88.9 fL (ref 78.0–100.0)
PLATELETS: 222 10*3/uL (ref 150–400)
RBC: 3.87 MIL/uL (ref 3.87–5.11)
RDW: 12.1 % (ref 11.5–15.5)
WBC: 6.1 10*3/uL (ref 4.0–10.5)

## 2015-08-21 LAB — RAPID URINE DRUG SCREEN, HOSP PERFORMED
Amphetamines: NOT DETECTED
BARBITURATES: NOT DETECTED
Benzodiazepines: NOT DETECTED
Cocaine: NOT DETECTED
Opiates: NOT DETECTED
Tetrahydrocannabinol: POSITIVE — AB

## 2015-08-21 LAB — URINALYSIS, ROUTINE W REFLEX MICROSCOPIC
BILIRUBIN URINE: NEGATIVE
Glucose, UA: NEGATIVE mg/dL
KETONES UR: NEGATIVE mg/dL
Leukocytes, UA: NEGATIVE
NITRITE: NEGATIVE
PROTEIN: NEGATIVE mg/dL
Specific Gravity, Urine: 1.02 (ref 1.005–1.030)
pH: 6 (ref 5.0–8.0)

## 2015-08-21 LAB — TYPE AND SCREEN
ABO/RH(D): B POS
ANTIBODY SCREEN: NEGATIVE

## 2015-08-21 LAB — HCG, QUANTITATIVE, PREGNANCY: HCG, BETA CHAIN, QUANT, S: 30732 m[IU]/mL — AB (ref <5)

## 2015-08-21 LAB — URINE MICROSCOPIC-ADD ON

## 2015-08-21 NOTE — Discharge Instructions (Signed)

## 2015-08-21 NOTE — MAU Provider Note (Signed)
MAU HISTORY AND Kristy HEIMANNf Complaint:  Abdominal Cramping and Back Pain   Kristy Little is a 25 y.o.  [id number] with IUP at [redacted]w[redacted]d presenting for Abdominal Cramping and Back Pain  Stomach pain since yesterday. Improving. Cramping. Middle low abdomen. No vomiting or diarrhea, no fever. No dysuria. Low back pain, no upper or side back pain. No sick contacts. No fevers. No bleeding. Diagnosed with chlamydia 3 days ago, treated. [facility] department. Says other std labs negative. Also told has yeast infection.    Past Medical History  Diagnosis Date  . No pertinent past medical history   . Migraine   . Pregnant   . Infection     UTI    Past Surgical History  Procedure Laterality Date  . No past surgeries    . Induced abortion      Family History  Problem Relation Age of Onset  . Stroke Mother   . Hypertension Mother   . Diabetes Paternal Aunt     Social History  Substance Use Topics  . Smoking status: Former Smoker    Types: Cigarettes  . Smokeless tobacco: Never Used     Comment: 2016  . Alcohol Use: No    Allergies  Allergen Reactions  . Latex Other (See Comments)    Irritation     Prescriptions prior to admission  Medication Sig Dispense Refill Last Dose  . ibuprofen (ADVIL,MOTRIN) 600 MG tablet Take 1 tablet (600 mg total) by mouth every 6 (six) hours as needed. 30 tablet 0 Taking  . misoprostol (CYTOTEC) 200 MCG tablet Place 4 tablets (800 mcg total) vaginally once. Place two tablets in the vagina the night prior to your next clinic appointment 8 tablet 0   . oxyCODONE-acetaminophen (PERCOCET/ROXICET) 5-325 MG per tablet Take 1-2 tablets by mouth every 6 (six) hours as needed for severe pain. 15 tablet 0 Taking  . Prenatal Vit-Fe Fumarate-FA (PRENATAL PO) Take 1 tablet by mouth daily.   Not Taking  . promethazine (PHENERGAN) 12.5 MG tablet Take 1 tablet (12.5 mg total) by mouth every 6 (six) hours as needed for nausea or vomiting. 30 tablet 0 Taking     Review of Systems - Negative except for what is mentioned in HPI.  Physical Exam  Blood pressure 112/76, pulse 80, temperature 97.8 F (36.6 C), temperature source Oral, resp. rate 18, height  (1.702 m), weight 116 lb 12.8 oz (52.98 kg), last menstrual period 07/10/2015, unknown if currently breastfeeding. GENERAL: Well-developed, well-nourished female in no acute distress.  LUNGS: Clear to auscultation bilaterally.  HEART: Regular rate and rhythm. ABDOMEN: Soft, nontender, nondistended, gravid.  EXTREMITIES: Nontender, no edema, 2+ distal pulses. Cervical Exam: deferred   Labs: Results for orders placed or performed during the hospital encounter of 08/21/15 (from the past 24 hour(s))  Urinalysis, Routine w reflex microscopic (not at [facility])   Collection Time: 08/21/15 12:00 PM  Result Value Ref Range   Color, Urine YELLOW YELLOW   APPearance CLEAR CLEAR   Specific Gravity, Urine 1.020 1.005 - 1.030   pH 6.0 5.0 - 8.0   Glucose, UA NEGATIVE NEGATIVE mg/dL   Hgb urine dipstick TRACE (A) NEGATIVE   Bilirubin Urine NEGATIVE NEGATIVE   Ketones, ur NEGATIVE NEGATIVE mg/dL   Protein, ur NEGATIVE NEGATIVE mg/dL   Nitrite NEGATIVE NEGATIVE   Leukocytes, UA NEGATIVE NEGATIVE  Urine rapid drug screen (hosp performed)   Collection Time: 08/21/15 12:00 PM  Result Value Ref Range   Opiates NONE DETECTED  NONE DETECTED   Cocaine NONE DETECTED NONE DETECTED   Benzodiazepines NONE DETECTED NONE DETECTED   Amphetamines NONE DETECTED NONE DETECTED   Tetrahydrocannabinol POSITIVE (A) NONE DETECTED   Barbiturates NONE DETECTED NONE DETECTED  Urine microscopic-add on   Collection Time: 08/21/15 12:00 PM  Result Value Ref Range   Squamous Epithelial / LPF 0-5 (A) NONE SEEN   WBC, UA 0-5 0 - 5 WBC/hpf   RBC / HPF 0-5 0 - 5 RBC/hpf   Bacteria, UA RARE (A) NONE SEEN  CBC   Collection Time: 08/21/15 12:39 PM  Result Value Ref Range   WBC 6.1 4.0 - 10.5 K/uL   RBC 3.87 3.87 - 5.11 MIL/uL    Hemoglobin 12.0 12.0 - 15.0 g/dL   HCT 78.234.4 (L) 95.636.0 - 21.346.0 %   MCV 88.9 78.0 - 100.0 fL   MCH 31.0 26.0 - 34.0 pg   MCHC 34.9 30.0 - 36.0 g/dL   RDW 08.612.1 57.811.5 - 46.915.5 %   Platelets 222 150 - 400 K/uL  hCG, quantitative, pregnancy   Collection Time: 08/21/15 12:39 PM  Result Value Ref Range   hCG, Beta Chain, Quant, S [id number] (H) <5 mIU/mL    Imaging Studies:  [place] Ob Comp Less 14 Wks  08/21/2015  CLINICAL DATA:  Pelvic pain 2 days. Quantitative beta HCG pending. Estimated gestational age 676 weeks 0 days per LMP. EXAM: OBSTETRIC <14 WK US AND TRANSVAGINAL OB US TECHNIQUE: Both transabdominal and transvaginal ultrasound examinations were performed for complete evaluation of the gestation as well as the maternal uterus, adnexal regions, and pelvic cul-de-sac. Transvaginal technique was performed to assess early pregnancy. COMPARISON:  None. FINDINGS: Intrauterine gestational sac: Single visualized gestational sac within normal. Yolk sac:  Visualized. Embryo:  Visualized. Cardiac Activity: Visualized. Heart Rate: 93  bpm CRL:  2.7  mm   5 w   6 d                  US EDC: 04/16/2016 Subchorionic hemorrhage:  None visualized. Maternal uterus/adnexae: Ovaries are normal.  No free pelvic fluid. IMPRESSION: Single live IUP with estimated gestational age [redacted] weeks 6 days. Electronically Signed   By: Elberta Fortisaniel  Boyle M.D.   On: 08/21/2015 13:55   [place] Ob Transvaginal  08/21/2015  CLINICAL DATA:  Pelvic pain 2 days. Quantitative beta HCG pending. Estimated gestational age 676 weeks 0 days per LMP. EXAM: OBSTETRIC <14 WK US AND TRANSVAGINAL OB US TECHNIQUE: Both transabdominal and transvaginal ultrasound examinations were performed for complete evaluation of the gestation as well as the maternal uterus, adnexal regions, and pelvic cul-de-sac. Transvaginal technique was performed to assess early pregnancy. COMPARISON:  None. FINDINGS: Intrauterine gestational sac: Single visualized gestational sac within normal. Yolk sac:   Visualized. Embryo:  Visualized. Cardiac Activity: Visualized. Heart Rate: 93  bpm CRL:  2.7  mm   5 w   6 d                  US EDC: 04/16/2016 Subchorionic hemorrhage:  None visualized. Maternal uterus/adnexae: Ovaries are normal.  No free pelvic fluid. IMPRESSION: Single live IUP with estimated gestational age [redacted] weeks 6 days. Electronically Signed   By: Elberta Fortisaniel  Boyle M.D.   On: 08/21/2015 13:55    Assessment: Kristy Little is  25 y.o. G3P0020 at 4719w0d presents with pelvic cramping. Here appears very well, mild tenderness to palpation on exam. No other uti symptoms and urinalysis unremarkable. No vaginal bleeding, and u/s showing  viable iup (prelim read). Recently dx and treated for chlamydia and yeast infection which could cause/contribute. PID rare in pregnancy, and patient well-appearing and afebrile with normal wbcs, so low suspicion for such.  Plan: - continue pnv - SAB return precautions - referring to our clinic, told also can establish with [facility] - partner treatment advised, abstinence until then advised - advised will need TOC  Anette Riedel B Zenab Gronewold 5/12/20172:15 PM

## 2015-08-21 NOTE — MAU Note (Signed)
Cramping in low back and lower stomach.  Started yesterday, thought it was going to go away, is getting worse. (pt very restless) denies urinary symptoms.

## 2015-09-15 ENCOUNTER — Ambulatory Visit (INDEPENDENT_AMBULATORY_CARE_PROVIDER_SITE_OTHER): Payer: Medicaid Other | Admitting: Student

## 2015-09-15 ENCOUNTER — Encounter: Payer: Self-pay | Admitting: Student

## 2015-09-15 ENCOUNTER — Other Ambulatory Visit (HOSPITAL_COMMUNITY)
Admission: RE | Admit: 2015-09-15 | Discharge: 2015-09-15 | Disposition: A | Discharge status: Home / Self Care | Payer: Medicaid Other | Source: Ambulatory Visit | Attending: Family Medicine | Admitting: Family Medicine

## 2015-09-15 VITALS — BP 105/66 | HR 72 | Wt 115.0 lb

## 2015-09-15 DIAGNOSIS — Z3491 Encounter for supervision of normal pregnancy, unspecified, first trimester: Secondary | ICD-10-CM

## 2015-09-15 DIAGNOSIS — O98819 Other maternal infectious and parasitic diseases complicating pregnancy, unspecified trimester: Secondary | ICD-10-CM

## 2015-09-15 DIAGNOSIS — Z3490 Encounter for supervision of normal pregnancy, unspecified, unspecified trimester: Secondary | ICD-10-CM | POA: Insufficient documentation

## 2015-09-15 DIAGNOSIS — Z113 Encounter for screening for infections with a predominantly sexual mode of transmission: Secondary | ICD-10-CM

## 2015-09-15 DIAGNOSIS — A749 Chlamydial infection, unspecified: Secondary | ICD-10-CM

## 2015-09-15 LAB — POCT URINALYSIS DIP (DEVICE)
Bilirubin Urine: NEGATIVE
Glucose, UA: NEGATIVE mg/dL
HGB URINE DIPSTICK: NEGATIVE
Ketones, ur: NEGATIVE mg/dL
LEUKOCYTES UA: NEGATIVE
NITRITE: NEGATIVE
PH: 7 (ref 5.0–8.0)
Protein, ur: 30 mg/dL — AB
Specific Gravity, Urine: 1.02 (ref 1.005–1.030)
UROBILINOGEN UA: 1 mg/dL (ref 0.0–1.0)

## 2015-09-15 NOTE — Patient Instructions (Signed)
First Trimester of Pregnancy The first trimester of pregnancy is from week 1 until the end of week 12 (months 1 through 3). A week after a sperm fertilizes an egg, the egg will implant on the wall of the uterus. This embryo will begin to develop into a baby. Genes from you and your partner are forming the baby. The female genes determine whether the baby is a boy or a girl. At 6-8 weeks, the eyes and face are formed, and the heartbeat can be seen on ultrasound. At the end of 12 weeks, all the baby's organs are formed.  Now that you are pregnant, you will want to do everything you can to have a healthy baby. Two of the most important things are to get good prenatal care and to follow your health care provider's instructions. Prenatal care is all the medical care you receive before the baby's birth. This care will help prevent, find, and treat any problems during the pregnancy and childbirth. BODY CHANGES Your body goes through many changes during pregnancy. The changes vary from woman to woman.   You may gain or lose a couple of pounds at first.  You may feel sick to your stomach (nauseous) and throw up (vomit). If the vomiting is uncontrollable, call your health care provider.  You may tire easily.  You may develop headaches that can be relieved by medicines approved by your health care provider.  You may urinate more often. Painful urination may mean you have a bladder infection.  You may develop heartburn as a result of your pregnancy.  You may develop constipation because certain hormones are causing the muscles that push waste through your intestines to slow down.  You may develop hemorrhoids or swollen, bulging veins (varicose veins).  Your breasts may begin to grow larger and become tender. Your nipples may stick out more, and the tissue that surrounds them (areola) may become darker.  Your gums may bleed and may be sensitive to brushing and flossing.  Dark spots or blotches (chloasma,  mask of pregnancy) may develop on your face. This will likely fade after the baby is born.  Your menstrual periods will stop.  You may have a loss of appetite.  You may develop cravings for certain kinds of food.  You may have changes in your emotions from day to day, such as being excited to be pregnant or being concerned that something may go wrong with the pregnancy and baby.  You may have more vivid and strange dreams.  You may have changes in your hair. These can include thickening of your hair, rapid growth, and changes in texture. Some women also have hair loss during or after pregnancy, or hair that feels dry or thin. Your hair will most likely return to normal after your baby is born. WHAT TO EXPECT AT YOUR PRENATAL VISITS During a routine prenatal visit:  You will be weighed to make sure you and the baby are growing normally.  Your blood pressure will be taken.  Your abdomen will be measured to track your baby's growth.  The fetal heartbeat will be listened to starting around week 10 or 12 of your pregnancy.  Test results from any previous visits will be discussed. Your health care provider may ask you:  How you are feeling.  If you are feeling the baby move.  If you have had any abnormal symptoms, such as leaking fluid, bleeding, severe headaches, or abdominal cramping.  If you are using any tobacco products,   including cigarettes, chewing tobacco, and electronic cigarettes.  If you have any questions. Other tests that may be performed during your first trimester include:  Blood tests to find your blood type and to check for the presence of any previous infections. They will also be used to check for low iron levels (anemia) and Rh antibodies. Later in the pregnancy, blood tests for diabetes will be done along with other tests if problems develop.  Urine tests to check for infections, diabetes, or protein in the urine.  An ultrasound to confirm the proper growth  and development of the baby.  An amniocentesis to check for possible genetic problems.  Fetal screens for spina bifida and Down syndrome.  You may need other tests to make sure you and the baby are doing well.  HIV (human immunodeficiency virus) testing. Routine prenatal testing includes screening for HIV, unless you choose not to have this test. HOME CARE INSTRUCTIONS  Medicines  Follow your health care provider's instructions regarding medicine use. Specific medicines may be either safe or unsafe to take during pregnancy.  Take your prenatal vitamins as directed.  If you develop constipation, try taking a stool softener if your health care provider approves. Diet  Eat regular, well-balanced meals. Choose a variety of foods, such as meat or vegetable-based protein, fish, milk and low-fat dairy products, vegetables, fruits, and whole grain breads and cereals. Your health care provider will help you determine the amount of weight gain that is right for you.  Avoid raw meat and uncooked cheese. These carry germs that can cause birth defects in the baby.  Eating four or five small meals rather than three large meals a day may help relieve nausea and vomiting. If you start to feel nauseous, eating a few soda crackers can be helpful. Drinking liquids between meals instead of during meals also seems to help nausea and vomiting.  If you develop constipation, eat more high-fiber foods, such as fresh vegetables or fruit and whole grains. Drink enough fluids to keep your urine clear or pale yellow. Activity and Exercise  Exercise only as directed by your health care provider. Exercising will help you:  Control your weight.  Stay in shape.  Be prepared for labor and delivery.  Experiencing pain or cramping in the lower abdomen or low back is a good sign that you should stop exercising. Check with your health care provider before continuing normal exercises.  Try to avoid standing for long  periods of time. Move your legs often if you must stand in one place for a long time.  Avoid heavy lifting.  Wear low-heeled shoes, and practice good posture.  You may continue to have sex unless your health care provider directs you otherwise. Relief of Pain or Discomfort  Wear a good support bra for breast tenderness.   Take warm sitz baths to soothe any pain or discomfort caused by hemorrhoids. Use hemorrhoid cream if your health care provider approves.   Rest with your legs elevated if you have leg cramps or low back pain.  If you develop varicose veins in your legs, wear support hose. Elevate your feet for 15 minutes, 3-4 times a day. Limit salt in your diet. Prenatal Care  Schedule your prenatal visits by the twelfth week of pregnancy. They are usually scheduled monthly at first, then more often in the last 2 months before delivery.  Write down your questions. Take them to your prenatal visits.  Keep all your prenatal visits as directed by your   health care provider. Safety  Wear your seat belt at all times when driving.  Make a list of emergency phone numbers, including numbers for family, friends, the hospital, and police and fire departments. General Tips  Ask your health care provider for a referral to a local prenatal education class. Begin classes no later than at the beginning of month 6 of your pregnancy.  Ask for help if you have counseling or nutritional needs during pregnancy. Your health care provider can offer advice or refer you to specialists for help with various needs.  Do not use hot tubs, steam rooms, or saunas.  Do not douche or use tampons or scented sanitary pads.  Do not cross your legs for long periods of time.  Avoid cat litter boxes and soil used by cats. These carry germs that can cause birth defects in the baby and possibly loss of the fetus by miscarriage or stillbirth.  Avoid all smoking, herbs, alcohol, and medicines not prescribed by  your health care provider. Chemicals in these affect the formation and growth of the baby.  Do not use any tobacco products, including cigarettes, chewing tobacco, and electronic cigarettes. If you need help quitting, ask your health care provider. You may receive counseling support and other resources to help you quit.  Schedule a dentist appointment. At home, brush your teeth with a soft toothbrush and be gentle when you floss. SEEK MEDICAL CARE IF:   You have dizziness.  You have mild pelvic cramps, pelvic pressure, or nagging pain in the abdominal area.  You have persistent nausea, vomiting, or diarrhea.  You have a bad smelling vaginal discharge.  You have pain with urination.  You notice increased swelling in your face, hands, legs, or ankles. SEEK IMMEDIATE MEDICAL CARE IF:   You have a fever.  You are leaking fluid from your vagina.  You have spotting or bleeding from your vagina.  You have severe abdominal cramping or pain.  You have rapid weight gain or loss.  You vomit blood or material that looks like coffee grounds.  You are exposed to German measles and have never had them.  You are exposed to fifth disease or chickenpox.  You develop a severe headache.  You have shortness of breath.  You have any kind of trauma, such as from a fall or a car accident.   This information is not intended to replace advice given to you by your health care provider. Make sure you discuss any questions you have with your health care provider.   Document Released: 03/22/2001 Document Revised: 04/18/2014 Document Reviewed: 02/05/2013 Elsevier Interactive Patient Education 2016 [employer]     Safe Medications in Pregnancy   Acne: Benzoyl Peroxide Salicylic Acid  Backache/Headache: Tylenol: 2 regular strength every 4 hours OR              2 Extra strength every 6 hours  Colds/Coughs/Allergies: Benadryl (alcohol free) 25 mg every 6 hours as needed Breath right  strips Claritin Cepacol throat lozenges Chloraseptic throat spray Cold-Eeze- up to three times per day Cough drops, alcohol free Flonase (by prescription only) Guaifenesin Mucinex Robitussin DM (plain only, alcohol free) Saline nasal spray/drops Sudafed (pseudoephedrine) & Actifed ** use only after [redacted] weeks gestation and if you do not have high blood pressure Tylenol Vicks Vaporub Zinc lozenges Zyrtec   Constipation: Colace Ducolax suppositories Fleet enema Glycerin suppositories Metamucil Milk of magnesia Miralax Senokot Smooth move tea  Diarrhea: Kaopectate Imodium A-D  *NO pepto Bismol    Hemorrhoids: Anusol Anusol HC Preparation H Tucks  Indigestion: Tums Maalox Mylanta Zantac  Pepcid  Insomnia: Benadryl (alcohol free) 25mg every 6 hours as needed Tylenol PM Unisom, no Gelcaps  Leg Cramps: Tums MagGel  Nausea/Vomiting:  Bonine Dramamine Emetrol Ginger extract Sea bands Meclizine  Nausea medication to take during pregnancy:  Unisom (doxylamine succinate 25 mg tablets) Take one tablet daily at bedtime. If symptoms are not adequately controlled, the dose can be increased to a maximum recommended dose of two tablets daily (1/2 tablet in the morning, 1/2 tablet mid-afternoon and one at bedtime). Vitamin B6 100mg tablets. Take one tablet twice a day (up to 200 mg per day).  Skin Rashes: Aveeno products Benadryl cream or 25mg every 6 hours as needed Calamine Lotion 1% cortisone cream  Yeast infection: Gyne-lotrimin 7 Monistat 7  Gum/tooth pain: Anbesol  **If taking multiple medications, please check labels to avoid duplicating the same active ingredients **take medication as directed on the label ** Do not exceed 4000 mg of tylenol in 24 hours **Do not take medications that contain aspirin or ibuprofen     

## 2015-09-15 NOTE — Progress Notes (Signed)
  Subjective:    Kristy Little is being seen today for her first obstetrical visit.  This is not a planned pregnancy. She is at 5360w5d gestation. Her obstetrical history is significant for previous miscarriage. Relationship with FOB: not involved. Pregnancy history fully reviewed.  Patient reports vaginal discharge. Recently treated for chlamydia in [facility]; last month. Denies having intercourse since treatment.  Review of Systems:   Review of Systems Review of Systems - Negative except vaginal discharge  Objective:     BP 105/66 mmHg  Pulse 72  Wt 115 lb (52.164 kg)  LMP 07/10/2015 Physical Exam  Exam Physical Examination: General appearance - alert, well appearing, and in no distress, oriented to person, place, and time and normal appearing weight Mental status - normal mood, behavior, speech, dress, motor activity, and thought processes Mouth - mucous membranes moist, pharynx normal without lesions and dental hygiene good Neck - supple, no significant adenopathy Heart - normal rate, regular rhythm, normal S1, S2, no murmurs, rubs, clicks or gallops Abdomen - soft, nontender, nondistended, no masses or organomegaly Pelvic - VULVA: normal appearing vulva with no masses, tenderness or lesions, PAP: Pap smear done today, Pap smear not performed, pt [facility]; will request records. Exam chaperoned by RN Musculoskeletal - no joint tenderness, deformity or swelling, full range of motion without pain Skin - normal coloration and turgor, no rashes, no suspicious skin lesions noted    Assessment:    Pregnancy: [id number] Patient Active Problem List   Diagnosis Date Noted  . Supervision of low-risk pregnancy 09/15/2015      Pt concerned about pregnancy d/t recent SAB. Unable to doppler FHT. Informal bedside u/s performed by D Poe CNM; cardiac activity present. Pt reassured.  Plan:  1. Supervision of low-risk pregnancy, first trimester  - Culture, OB Urine -  GC/Chlamydia probe amp ([place])[facility] - US MFM Fetal Nuchal Translucency; Future - [id number] PDM PROFILE - Hemoglobinopathy evaluation - Prenatal Profile - Wet prep, genital    Initial labs drawn. Prenatal vitamins. Problem list reviewed and updated. First trimester screen: ordered. Role of ultrasound in pregnancy discussed; fetal survey: ordered. Amniocentesis discussed: not indicated. Follow up in 4 weeks.    Kristy Little 09/16/2015

## 2015-09-15 NOTE — Progress Notes (Signed)
New ob packet given  First Trimester Screen scheduled for June 30th @ 1500.   Home Medicaid Form Completed

## 2015-09-16 ENCOUNTER — Other Ambulatory Visit: Payer: Self-pay | Admitting: Student

## 2015-09-16 DIAGNOSIS — B9689 Other specified bacterial agents as the cause of diseases classified elsewhere: Secondary | ICD-10-CM

## 2015-09-16 DIAGNOSIS — A749 Chlamydial infection, unspecified: Secondary | ICD-10-CM | POA: Insufficient documentation

## 2015-09-16 DIAGNOSIS — N760 Acute vaginitis: Principal | ICD-10-CM

## 2015-09-16 DIAGNOSIS — O98819 Other maternal infectious and parasitic diseases complicating pregnancy, unspecified trimester: Secondary | ICD-10-CM

## 2015-09-16 LAB — PRENATAL PROFILE (SOLSTAS)
Antibody Screen: NEGATIVE
Basophils Absolute: 0 cells/uL (ref 0–200)
Basophils Relative: 0 %
Eosinophils Absolute: 61 cells/uL (ref 15–500)
Eosinophils Relative: 1 %
HCT: 35 % (ref 35.0–45.0)
HEMOGLOBIN: 11.8 g/dL (ref 11.7–15.5)
HEP B S AG: NEGATIVE
HIV: NONREACTIVE
LYMPHS ABS: 2196 {cells}/uL (ref 850–3900)
Lymphocytes Relative: 36 %
MCH: 30.3 pg (ref 27.0–33.0)
MCHC: 33.7 g/dL (ref 32.0–36.0)
MCV: 90 fL (ref 80.0–100.0)
MONO ABS: 549 {cells}/uL (ref 200–950)
MPV: 10.2 fL (ref 7.5–12.5)
Monocytes Relative: 9 %
NEUTROS ABS: 3294 {cells}/uL (ref 1500–7800)
Neutrophils Relative %: 54 %
Platelets: 241 10*3/uL (ref 140–400)
RBC: 3.89 MIL/uL (ref 3.80–5.10)
RDW: 12.8 % (ref 11.0–15.0)
RUBELLA: 1.53 {index} — AB (ref <0.90)
Rh Type: POSITIVE
WBC: 6.1 10*3/uL (ref 3.8–10.8)

## 2015-09-16 LAB — WET PREP, GENITAL
TRICH WET PREP: NONE SEEN
YEAST WET PREP: NONE SEEN

## 2015-09-16 LAB — GC/CHLAMYDIA PROBE AMP (~~LOC~~) NOT AT ARMC
Chlamydia: NEGATIVE
NEISSERIA GONORRHEA: NEGATIVE

## 2015-09-16 MED ORDER — METRONIDAZOLE 500 MG PO TABS: 500.0000 mg | ORAL_TABLET | Freq: Two times a day (BID) | ORAL | Status: DC

## 2015-09-17 LAB — CULTURE, OB URINE
Colony Count: NO GROWTH
ORGANISM ID, BACTERIA: NO GROWTH

## 2015-09-17 LAB — HEMOGLOBINOPATHY EVALUATION
Hemoglobin Other: 0 %
Hgb A2 Quant: 2.5 % (ref 1.8–3.5)
Hgb A: 97.5 % (ref >96.0)
Hgb F Quant: 0 % (ref <2.0)
Hgb S Quant: 0 %

## 2015-09-20 LAB — CP5000051 PDM PROFILE
Amphetamines: NEGATIVE ng/mL (ref <500)
BENZODIAZEPINES: NEGATIVE ng/mL (ref <100)
BUPRENORPHINE: NEGATIVE ng/mL (ref <5)
Barbiturates: NEGATIVE ng/mL (ref <300)
COCAINE METABOLITE: NEGATIVE ng/mL (ref <150)
DESMETHYLTRAMADOL: NEGATIVE ng/mL (ref <100)
Fentanyl: NEGATIVE ng/mL (ref <0.5)
MDMA: NEGATIVE ng/mL (ref <500)
MEPERIDINE: NEGATIVE ng/mL (ref <100)
MEPROBAMATE: NEGATIVE ng/mL (ref <1000)
Marijuana Metabolite: 36 ng/mL — ABNORMAL HIGH (ref <5)
Marijuana Metabolite: POSITIVE ng/mL — AB (ref <20)
Methadone Metabolite: NEGATIVE ng/mL (ref <100)
NORFENTANYL: NEGATIVE ng/mL (ref <0.5)
NORMEPERIDINE: NEGATIVE ng/mL (ref <100)
Nortapentadol: NEGATIVE ng/mL (ref <50)
OPIATES: NEGATIVE ng/mL (ref <100)
OXYCODONE: NEGATIVE ng/mL (ref <100)
Phencyclidine: NEGATIVE ng/mL (ref <25)
Propoxyphene: NEGATIVE ng/mL (ref <300)
Tapentadol: NEGATIVE ng/mL (ref <50)
Tramadol: NEGATIVE ng/mL (ref <100)
ZOLPIDEM METABOLITE: NEGATIVE ng/mL (ref <5)
ZOLPIDEM: NEGATIVE ng/mL (ref <5)

## 2015-09-30 ENCOUNTER — Encounter (HOSPITAL_COMMUNITY): Payer: Self-pay | Admitting: Student

## 2015-10-09 ENCOUNTER — Ambulatory Visit (HOSPITAL_COMMUNITY): Payer: Medicaid Other

## 2015-10-19 ENCOUNTER — Encounter: Payer: Medicaid Other | Admitting: Certified Nurse Midwife

## 2016-01-06 ENCOUNTER — Encounter: Payer: Self-pay | Admitting: Family Medicine

## 2016-01-06 ENCOUNTER — Telehealth: Payer: Self-pay | Admitting: Family Medicine

## 2016-01-06 DIAGNOSIS — Z3490 Encounter for supervision of normal pregnancy, unspecified, unspecified trimester: Secondary | ICD-10-CM | POA: Insufficient documentation

## 2016-01-11 ENCOUNTER — Encounter: Payer: Medicaid Other | Admitting: Obstetrics and Gynecology

## 2016-01-12 ENCOUNTER — Telehealth: Payer: Self-pay | Admitting: Family Medicine

## 2016-01-13 ENCOUNTER — Encounter: Payer: Medicaid Other | Admitting: Obstetrics and Gynecology

## 2016-01-14 ENCOUNTER — Telehealth: Payer: Self-pay | Admitting: Family Medicine

## 2016-01-19 ENCOUNTER — Encounter: Payer: Self-pay | Admitting: Advanced Practice Midwife

## 2016-01-19 ENCOUNTER — Encounter: Payer: Medicaid Other | Admitting: Advanced Practice Midwife

## 2016-06-25 ENCOUNTER — Encounter (HOSPITAL_COMMUNITY): Payer: Self-pay

## 2017-06-20 ENCOUNTER — Emergency Department (HOSPITAL_COMMUNITY)
Admission: EM | Admit: 2017-06-20 | Discharge: 2017-06-20 | Disposition: A | Discharge status: Home / Self Care | Payer: Self-pay | Attending: Emergency Medicine | Admitting: Emergency Medicine

## 2017-06-20 ENCOUNTER — Other Ambulatory Visit: Payer: Self-pay

## 2017-06-20 ENCOUNTER — Encounter (HOSPITAL_COMMUNITY): Payer: Self-pay | Admitting: Emergency Medicine

## 2017-06-20 DIAGNOSIS — Z91040 Latex allergy status: Secondary | ICD-10-CM | POA: Insufficient documentation

## 2017-06-20 DIAGNOSIS — Z79899 Other long term (current) drug therapy: Secondary | ICD-10-CM | POA: Insufficient documentation

## 2017-06-20 DIAGNOSIS — Z87891 Personal history of nicotine dependence: Secondary | ICD-10-CM | POA: Insufficient documentation

## 2017-06-20 DIAGNOSIS — K0889 Other specified disorders of teeth and supporting structures: Secondary | ICD-10-CM | POA: Insufficient documentation

## 2017-06-20 MED ORDER — PENICILLIN V POTASSIUM 500 MG PO TABS: 500.0000 mg | ORAL_TABLET | Freq: Four times a day (QID) | ORAL | 0 refills | Status: AC

## 2017-06-20 MED ORDER — IBUPROFEN 800 MG PO TABS: 800.0000 mg | ORAL_TABLET | Freq: Three times a day (TID) | ORAL | 0 refills | Status: DC

## 2017-06-20 MED ORDER — ACETAMINOPHEN 500 MG PO TABS
1000.0000 mg | ORAL_TABLET | Freq: Once | ORAL | Status: AC
  Administered 2017-06-20: 1000 mg via ORAL
  Filled 2017-06-20: qty 2

## 2017-06-20 MED ORDER — IBUPROFEN 400 MG PO TABS
400.0000 mg | ORAL_TABLET | Freq: Once | ORAL | Status: AC
  Administered 2017-06-20: 400 mg via ORAL
  Filled 2017-06-20: qty 1

## 2017-06-20 NOTE — ED Provider Notes (Signed)
[facility] Provider Note   CSN: [id number] Arrival date & time: 06/20/17  0507     History   Chief Complaint Chief Complaint  Patient presents with  . Dental Pain    HPI Kristy Little is a 27 y.o. female.  The history is provided by the patient and medical records. No language interpreter was used.  Dental Pain      Kristy Little is a 27 y.o. female who presents to the Emergency Department complaining of persistent, gradually worsening, left-sided, lower dental pain beginning one week ago. Pt describes their pain as throbbing. Pt has been taking ibuprofen at home with minimal relief of pain. Pain is exacerbated by cold air, eating. They are not followed by dentistry.  Pt denies facial swelling, fever, chills, difficulty breathing, difficulty swallowing.   Past Medical History:  Diagnosis Date  . Infection    UTI  . Migraine   . Pregnant     Patient Active Problem List   Diagnosis Date Noted  . Unplanned pregnancy 01/06/2016  . Chlamydia infection during pregnancy, antepartum 09/16/2015  . Supervision of low-risk pregnancy 09/15/2015    Past Surgical History:  Procedure Laterality Date  . INDUCED ABORTION    . NO PAST SURGERIES      OB History    Gravida Para Term Preterm AB Living   3       2 0   SAB TAB Ectopic Multiple Live Births   1 1             Home Medications    Prior to Admission medications   Medication Sig Start Date End Date Taking? Authorizing Provider  ibuprofen (ADVIL,MOTRIN) 800 MG tablet Take 1 tablet (800 mg total) by mouth 3 (three) times daily. 06/20/17   Bhavesh Vazquez, Chase PicketJaime Pilcher, PA-C  metroNIDAZOLE (FLAGYL) 500 MG tablet Take 1 tablet (500 mg total) by mouth 2 (two) times daily. 09/16/15   Judeth HornLawrence, Erin, NP  penicillin v potassium (VEETID) 500 MG tablet Take 1 tablet (500 mg total) by mouth 4 (four) times daily for 7 days. 06/20/17 06/27/17  Aaryanna Hyden, Chase PicketJaime Pilcher, PA-C  Prenatal Vit-Fe  Fumarate-FA (PRENATAL PO) Take 1 tablet by mouth daily.    [provider]    Family History Family History  Problem Relation Age of Onset  . Stroke Mother   . Hypertension Mother   . Diabetes Paternal Aunt     Social History Social History   Tobacco Use  . Smoking status: Former Smoker    Types: Cigarettes  . Smokeless tobacco: Never Used  . Tobacco comment: 2016  Substance Use Topics  . Alcohol use: No  . Drug use: No     Allergies   Latex   Review of Systems Review of Systems  Constitutional: Negative for chills and fever.  HENT: Positive for dental problem. Negative for congestion, sore throat and trouble swallowing.   Respiratory: Negative for shortness of breath.      Physical Exam Updated Vital Signs BP (!) 144/104 (BP Location: Right Arm)   Pulse 87   Temp 98.1 F (36.7 C) (Oral)   Resp 16   Ht 5\' 5"  (1.651 m)   Wt 52.6 kg (116 lb)   LMP  (LMP Unknown)   SpO2 100%   BMI 19.30 kg/m   Physical Exam  Constitutional: She appears well-developed and well-nourished. No distress.  HENT:  Head: Normocephalic and atraumatic.  Mouth/Throat:    Pain along tooth as  depicted in image. No abscess noted. Midline uvula. No trismus. OP moist and clear. No oropharyngeal erythema or edema. Neck supple with no tenderness. No facial edema.  Neck: Neck supple.  Cardiovascular: Normal rate, regular rhythm and normal heart sounds.  No murmur heard. Pulmonary/Chest: Effort normal and breath sounds normal. No respiratory distress. She has no wheezes. She has no rales.  Musculoskeletal: Normal range of motion.  Neurological: She is alert.  Skin: Skin is warm and dry.  Nursing note and vitals reviewed.    ED Treatments / Results  Labs (all labs ordered are listed, but only abnormal results are displayed) Labs Reviewed - No data to display  EKG  EKG Interpretation None       Radiology No results found.  Procedures Procedures (including critical  care time)  Medications Ordered in ED Medications  ibuprofen (ADVIL,MOTRIN) tablet 400 mg (400 mg Oral Given 06/20/17 0542)     Initial Impression / Assessment and Plan / ED Course  I have reviewed the triage vital signs and the nursing notes.  Pertinent labs & imaging results that were available during my care of the patient were reviewed by me and considered in my medical decision making (see chart for details).     [name] is a 27 y.o. female who presents to ED for dental pain. No abscess requiring immediate incision and drainage. Patient is afebrile, non toxic appearing, and swallowing secretions well. Exam not concerning for Ludwig's angina or pharyngeal abscess. Will treat with PenVK. I provided dental resource guide and stressed the importance of dental follow up for ultimate management of dental pain. Patient voices understanding and is agreeable to plan.  Final Clinical Impressions(s) / ED Diagnoses   Final diagnoses:  Pain, dental    ED Discharge Orders        Ordered    penicillin v potassium (VEETID) 500 MG tablet  4 times daily     06/20/17 0652    ibuprofen (ADVIL,MOTRIN) 800 MG tablet  3 times daily     06/20/17 5638       Purvis Sidle, Chase Picket, PA-C 06/20/17 7564    Zadie Rhine, MD 06/20/17 2308

## 2017-06-20 NOTE — Discharge Instructions (Addendum)
You have a dental injury/infection. It is very important that you get evaluated by a dentist as soon as possible. Call tomorrow to schedule an appointment. As we discussed, call the on-call dentist listed and see if he has a clinic in [place]. If not, see the attached resource guide for dentists in the area. Ibuprofen as needed for pain. Take your full course of antibiotics. Read the instructions below.  Eat a soft or liquid diet and rinse your mouth out after meals with warm water. You should see a dentist or return here at once if you have increased swelling, increased pain or uncontrolled bleeding from the site of your injury.  SEEK MEDICAL CARE IF:  You have swelling around your tooth, in your face or neck.  You have bleeding which starts, continues, or gets worse.  You have a fever >101 If you are unable to open your mouth

## 2017-06-20 NOTE — ED Triage Notes (Signed)
Pt reports L dental pain for one week with headache, described as sharp. No meds PTA. Needs a dental referral.

## 2017-06-20 NOTE — ED Notes (Signed)
Pt requesting more pain meds prior to discharge

## 2017-07-17 ENCOUNTER — Encounter: Payer: Self-pay | Admitting: *Deleted

## 2017-12-11 IMAGING — US US OB TRANSVAGINAL
1 series · 15 of 28 positions shown · non-contrast
Comparison: None.

CLINICAL DATA: Pelvic pain 2 days. Quantitative beta HCG pending.
Estimated gestational age 6 weeks 0 days per LMP.

EXAM:
OBSTETRIC <14 WK US AND TRANSVAGINAL OB US
TECHNIQUE: Both transabdominal and transvaginal ultrasound examinations were
performed for complete evaluation of the gestation as well as the
maternal uterus, adnexal regions, and pelvic cul-de-sac.
Transvaginal technique was performed to assess early pregnancy.

[Series 1: us ob transvaginal · 44 acquisitions, 15 frames shown]
[im 1/44]
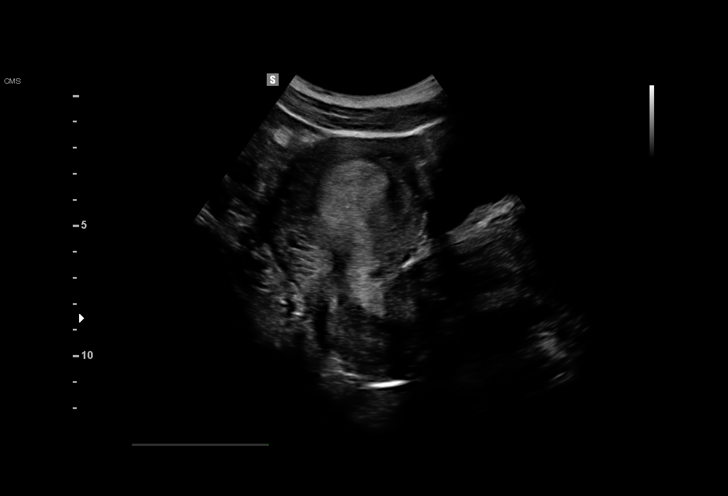
[im 4/44]
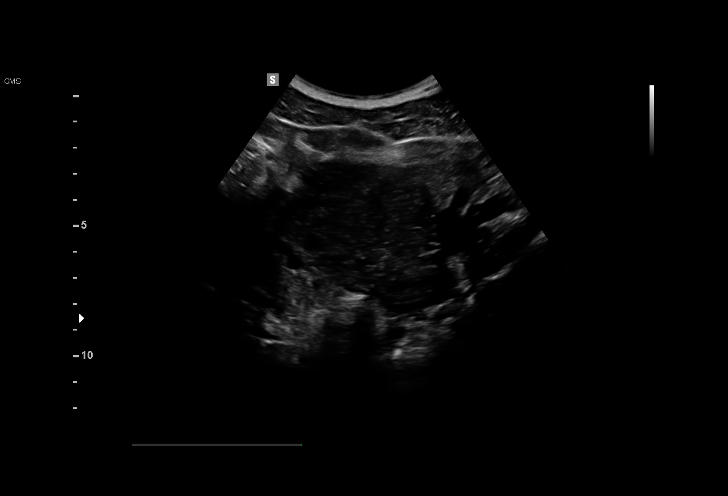
[im 7/44]
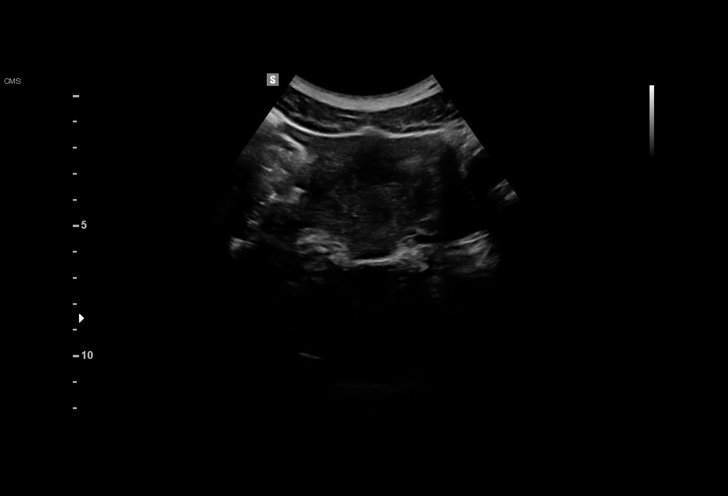
[im 10/44]
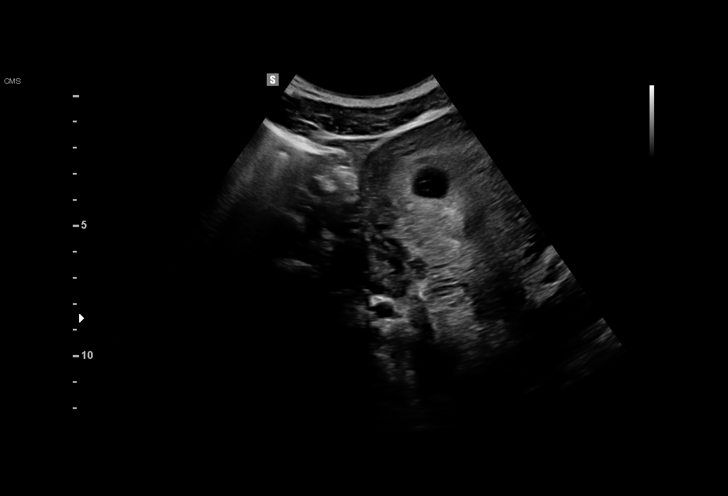
[im 13/44]
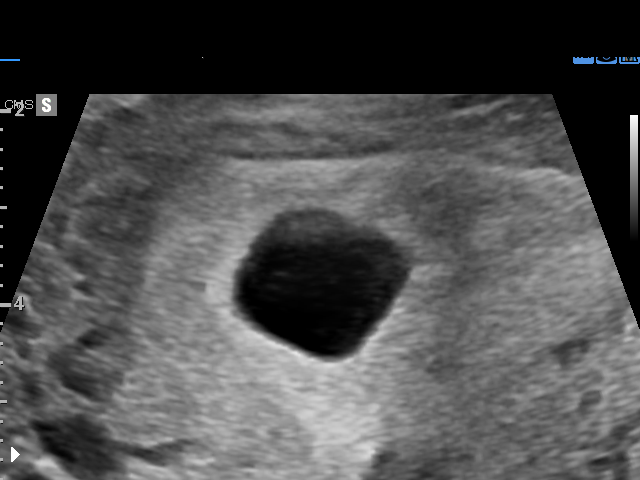
[im 16/44]
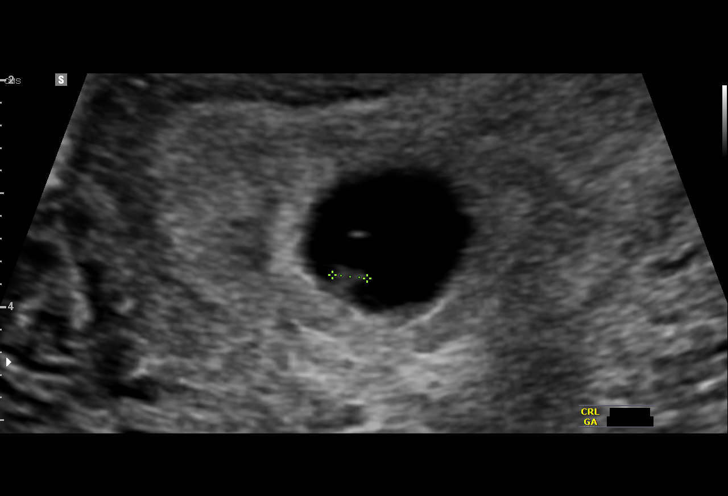
[im 20/44]
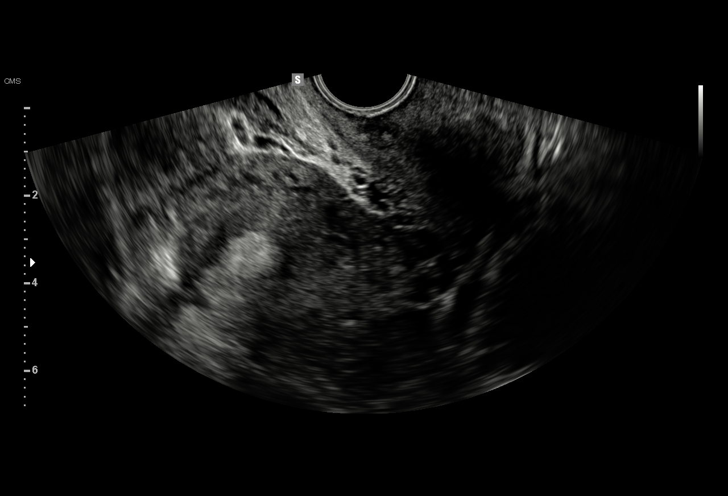
[im 23/44]
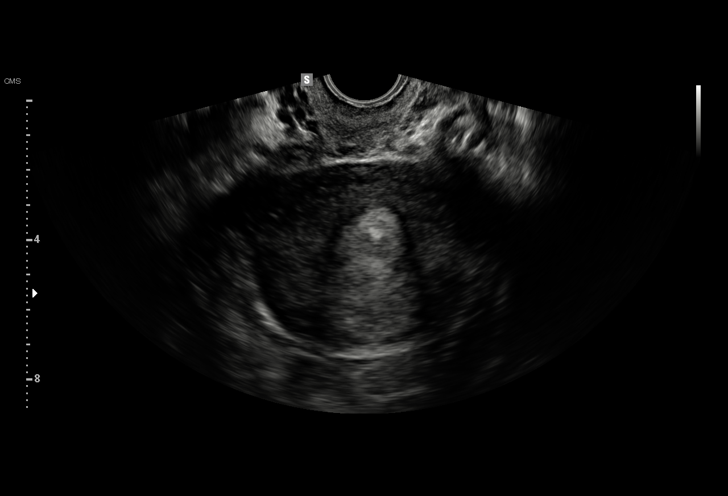
[im 24/44]
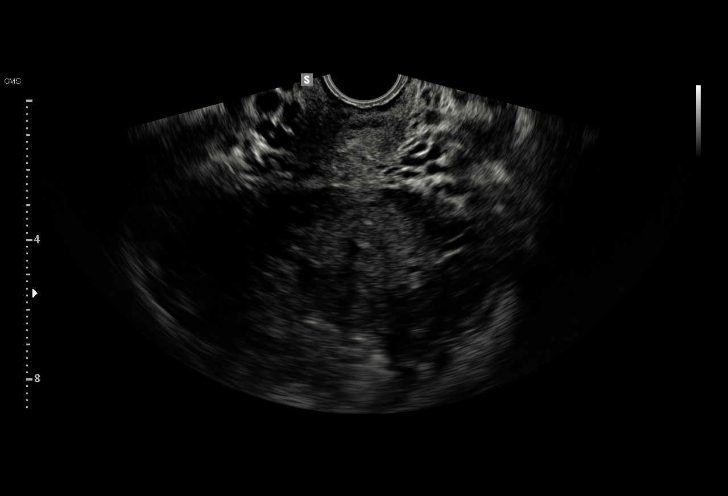
[im 28/44]
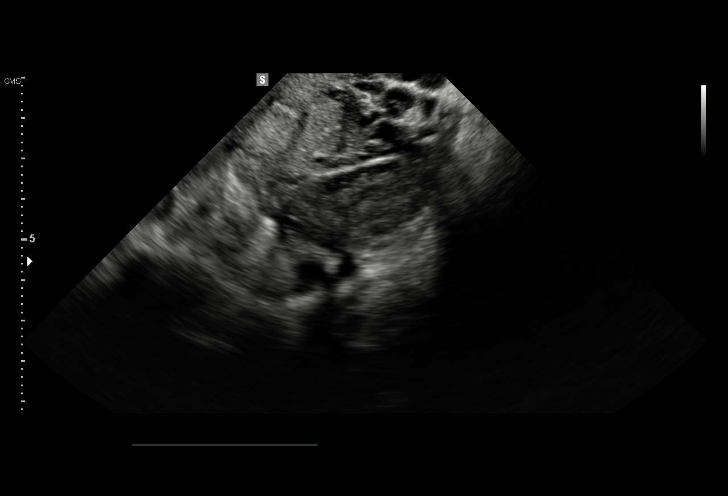
[im 31/44]
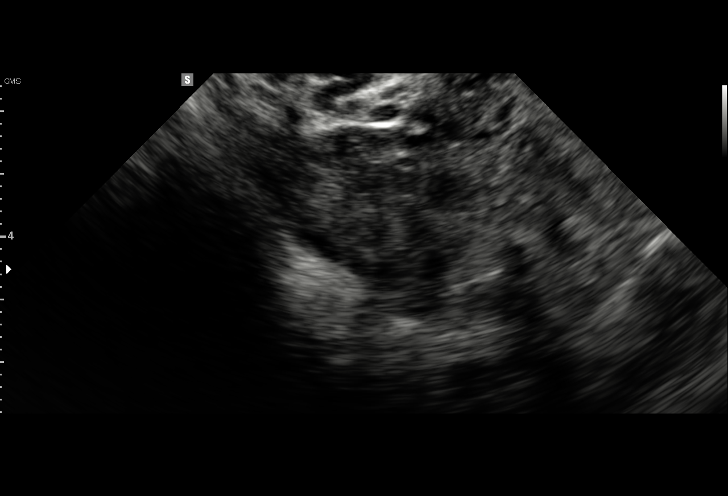
[im 34/44]
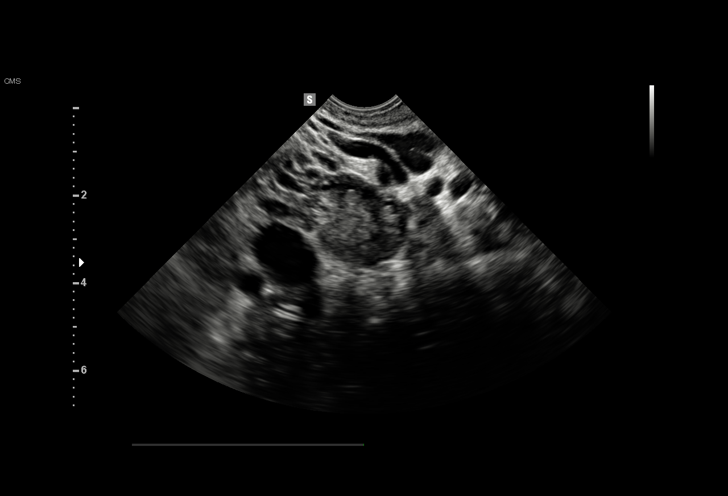
[im 37/44]
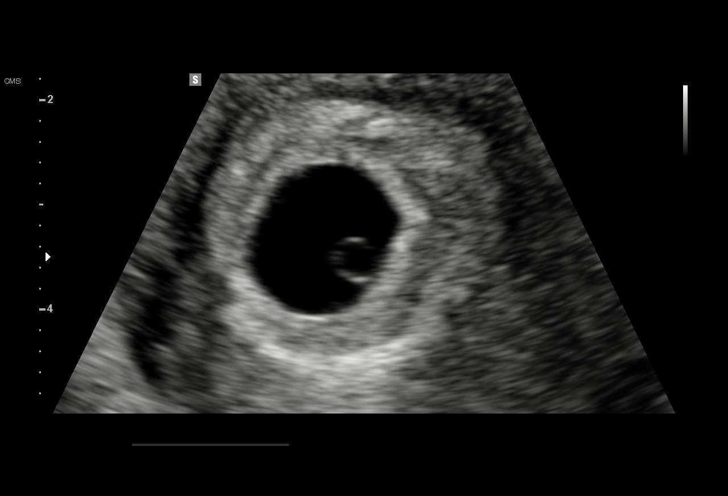
[im 40/44]
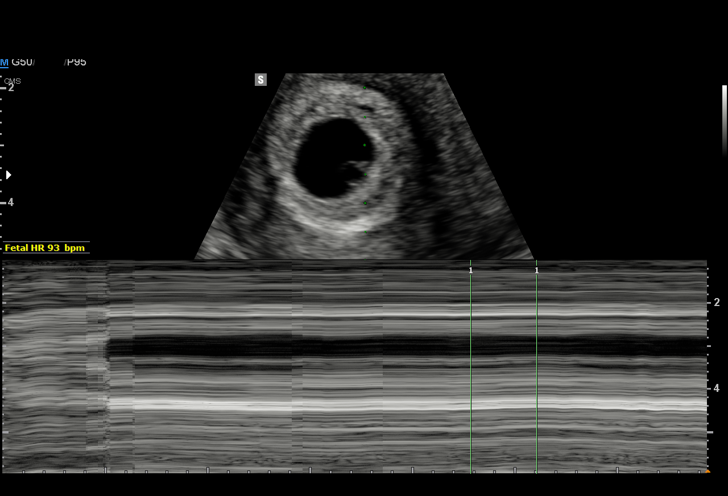
[im 44/44]
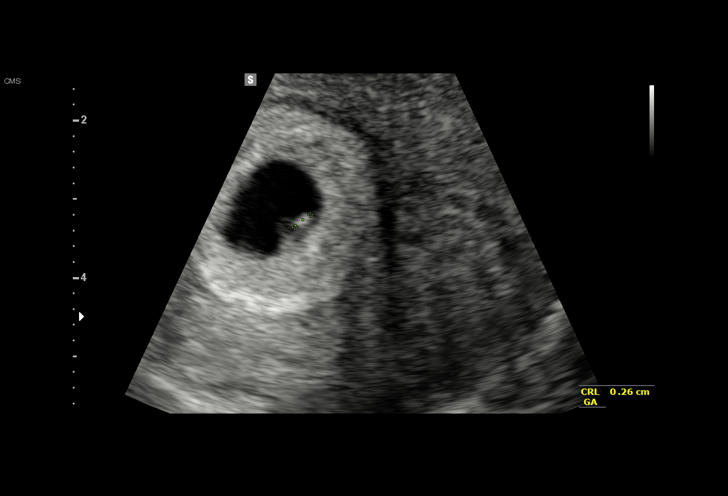

[15 of 28 positions shown; findings below may reference images not displayed]

FINDINGS: Intrauterine gestational sac: Single visualized gestational sac
within normal.

Yolk sac:  Visualized.

Embryo:  Visualized.

Cardiac Activity: Visualized.

Heart Rate: 93  bpm

CRL:  2.7  mm   5 w   6 d                  US EDC: 04/16/2016

Subchorionic hemorrhage:  None visualized.

Maternal uterus/adnexae: Ovaries are normal.  No free pelvic fluid.
IMPRESSION: Single live IUP with estimated gestational age 5 weeks 6 days.

## 2018-03-21 ENCOUNTER — Encounter (HOSPITAL_COMMUNITY): Payer: Self-pay | Admitting: Emergency Medicine

## 2018-03-21 ENCOUNTER — Inpatient Hospital Stay (HOSPITAL_COMMUNITY): Payer: Medicaid Other

## 2018-03-21 ENCOUNTER — Inpatient Hospital Stay (HOSPITAL_COMMUNITY)
Admission: AD | Admit: 2018-03-21 | Discharge: 2018-03-21 | Disposition: A | Discharge status: Home / Self Care | Payer: Medicaid Other | Attending: Obstetrics & Gynecology | Admitting: Obstetrics & Gynecology

## 2018-03-21 DIAGNOSIS — N83202 Unspecified ovarian cyst, left side: Secondary | ICD-10-CM | POA: Insufficient documentation

## 2018-03-21 DIAGNOSIS — O26891 Other specified pregnancy related conditions, first trimester: Secondary | ICD-10-CM | POA: Diagnosis not present

## 2018-03-21 DIAGNOSIS — Z3491 Encounter for supervision of normal pregnancy, unspecified, first trimester: Secondary | ICD-10-CM

## 2018-03-21 DIAGNOSIS — O3481 Maternal care for other abnormalities of pelvic organs, first trimester: Secondary | ICD-10-CM | POA: Diagnosis not present

## 2018-03-21 DIAGNOSIS — R109 Unspecified abdominal pain: Secondary | ICD-10-CM | POA: Diagnosis present

## 2018-03-21 DIAGNOSIS — Z3A08 8 weeks gestation of pregnancy: Secondary | ICD-10-CM

## 2018-03-21 DIAGNOSIS — Z87891 Personal history of nicotine dependence: Secondary | ICD-10-CM | POA: Insufficient documentation

## 2018-03-21 LAB — CBC
HCT: 33.6 % — ABNORMAL LOW (ref 36.0–46.0)
Hemoglobin: 11.2 g/dL — ABNORMAL LOW (ref 12.0–15.0)
MCH: 31 pg (ref 26.0–34.0)
MCHC: 33.3 g/dL (ref 30.0–36.0)
MCV: 93.1 fL (ref 80.0–100.0)
Platelets: 236 10*3/uL (ref 150–400)
RBC: 3.61 MIL/uL — ABNORMAL LOW (ref 3.87–5.11)
RDW: 12.1 % (ref 11.5–15.5)
WBC: 5.2 10*3/uL (ref 4.0–10.5)
nRBC: 0 % (ref 0.0–0.2)

## 2018-03-21 LAB — URINALYSIS, ROUTINE W REFLEX MICROSCOPIC
Bilirubin Urine: NEGATIVE
GLUCOSE, UA: NEGATIVE mg/dL
HGB URINE DIPSTICK: NEGATIVE
Ketones, ur: NEGATIVE mg/dL
Leukocytes, UA: NEGATIVE
Nitrite: NEGATIVE
PROTEIN: NEGATIVE mg/dL
Specific Gravity, Urine: 1.028 (ref 1.005–1.030)
pH: 6 (ref 5.0–8.0)

## 2018-03-21 LAB — OB RESULTS CONSOLE HIV ANTIBODY (ROUTINE TESTING): HIV: NONREACTIVE

## 2018-03-21 LAB — HCG, QUANTITATIVE, PREGNANCY: hCG, Beta Chain, Quant, S: 21799 m[IU]/mL — ABNORMAL HIGH (ref <5)

## 2018-03-21 LAB — POCT PREGNANCY, URINE: Preg Test, Ur: POSITIVE — AB

## 2018-03-21 LAB — OB RESULTS CONSOLE HEPATITIS B SURFACE ANTIGEN: Hepatitis B Surface Ag: NEGATIVE

## 2018-03-21 LAB — OB RESULTS CONSOLE RPR: RPR: NONREACTIVE

## 2018-03-21 LAB — OB RESULTS CONSOLE RUBELLA ANTIBODY, IGM: Rubella: IMMUNE

## 2018-03-21 NOTE — MAU Provider Note (Addendum)
Chief Complaint: Abdominal Pain   First Provider Initiated Contact with Patient 03/21/18 1048     SUBJECTIVE HPI: Kristy Little is a 27 y.o. G4P0020 at [redacted]w[redacted]d by LMP who presents to Maternity Admissions reporting abdominal pain. Symptoms started last week. Reports intermittent lower abdominal pain in her LLQ. Denies n/v/d, constipation, dysuria, vaginal bleeding, fever/chills, or vaginal discharge.  Pt had STD testing at [facility] 2 weeks ago & reports testing was negative.   Location: LLQ abdomen Quality: sharp Severity: 5/10 on pain scale Duration: 1 week Timing: intermittent. Lasts for a few seconds at a time, occurs several times per day Modifying factors: none Associated signs and symptoms: none  Past Medical History:  Diagnosis Date  . Infection    UTI  . Migraine    OB History  Gravida Para Term Preterm AB Living  4       3 0  SAB TAB Ectopic Multiple Live Births  3 0          # Outcome Date GA Lbr Len/2nd Weight Sex Delivery Anes PTL Lv  4 Current           3 SAB 09/11/14 [redacted]w[redacted]d         2 SAB           1 SAB            Past Surgical History:  Procedure Laterality Date  . INDUCED ABORTION     Social History   Socioeconomic History  . Marital status: Single    Spouse name: Not on file  . Number of children: Not on file  . Years of education: Not on file  . Highest education level: Not on file  Occupational History  . Not on file  Social Needs  . Financial resource strain: Not on file  . Food insecurity:    Worry: Not on file    Inability: Not on file  . Transportation needs:    Medical: Not on file    Non-medical: Not on file  Tobacco Use  . Smoking status: Former Smoker    Types: Cigarettes  . Smokeless tobacco: Never Used  . Tobacco comment: 2016  Substance and Sexual Activity  . Alcohol use: No  . Drug use: No  . Sexual activity: Yes  Lifestyle  . Physical activity:    Days per week: Not on file    Minutes per session: Not on file  . Stress:  Not on file  Relationships  . Social connections:    Talks on phone: Not on file    Gets together: Not on file    Attends religious service: Not on file    Active member of club or organization: Not on file    Attends meetings of clubs or organizations: Not on file    Relationship status: Not on file  . Intimate partner violence:    Fear of current or ex partner: Not on file    Emotionally abused: Not on file    Physically abused: Not on file    Forced sexual activity: Not on file  Other Topics Concern  . Not on file  Social History Narrative  . Not on file   Family History  Problem Relation Age of Onset  . Stroke Mother   . Hypertension Mother   . Diabetes Paternal Aunt    No current facility-administered medications on file prior to encounter.    Current Outpatient Medications on File Prior to Encounter  Medication Sig Dispense  Refill  . Prenatal Vit-Fe Fumarate-FA (PRENATAL PO) Take 1 tablet by mouth daily.     Allergies  Allergen Reactions  . Latex Other (See Comments)    Irritation     I have reviewed patient's Past Medical Hx, Surgical Hx, Family Hx, Social Hx, medications and allergies.   Review of Systems  Constitutional: Negative.   Gastrointestinal: Positive for abdominal pain. Negative for constipation, diarrhea, nausea and vomiting.  Genitourinary: Negative.     OBJECTIVE Patient Vitals for the past 24 hrs:  BP Temp Temp src Pulse Resp SpO2 Height  03/21/18 1254 109/79 98.2 F (36.8 C) Oral 79 17 100 % -  03/21/18 1035 97/71 98.2 F (36.8 C) Oral 67 17 100 % 5\' 5"  (1.651 m)   Constitutional: Well-developed, well-nourished female in no acute distress.  Cardiovascular: normal rate & rhythm, no murmur Respiratory: normal rate and effort. Lung sounds clear throughout GI: Abd soft, non-tender, Pos BS x 4. No guarding or rebound tenderness MS: Extremities nontender, no edema, normal ROM Neurologic: Alert and oriented x 4.     LAB RESULTS Results for  orders placed or performed during the hospital encounter of 03/21/18 (from the past 24 hour(s))  Pregnancy, urine POC     Status: Abnormal   Collection Time: 03/21/18 10:34 AM  Result Value Ref Range   Preg Test, Ur POSITIVE (A) NEGATIVE  Urinalysis, Routine w reflex microscopic     Status: Abnormal   Collection Time: 03/21/18 10:35 AM  Result Value Ref Range   Color, Urine YELLOW YELLOW   APPearance HAZY (A) CLEAR   Specific Gravity, Urine 1.028 1.005 - 1.030   pH 6.0 5.0 - 8.0   Glucose, UA NEGATIVE NEGATIVE mg/dL   Hgb urine dipstick NEGATIVE NEGATIVE   Bilirubin Urine NEGATIVE NEGATIVE   Ketones, ur NEGATIVE NEGATIVE mg/dL   Protein, ur NEGATIVE NEGATIVE mg/dL   Nitrite NEGATIVE NEGATIVE   Leukocytes, UA NEGATIVE NEGATIVE  CBC     Status: Abnormal   Collection Time: 03/21/18 11:08 AM  Result Value Ref Range   WBC 5.2 4.0 - 10.5 K/uL   RBC 3.61 (L) 3.87 - 5.11 MIL/uL   Hemoglobin 11.2 (L) 12.0 - 15.0 g/dL   HCT 96.033.6 (L) 45.436.0 - 09.846.0 %   MCV 93.1 80.0 - 100.0 fL   MCH 31.0 26.0 - 34.0 pg   MCHC 33.3 30.0 - 36.0 g/dL   RDW 11.912.1 14.711.5 - 82.915.5 %   Platelets 236 150 - 400 K/uL   nRBC 0.0 0.0 - 0.2 %  hCG, quantitative, pregnancy     Status: Abnormal   Collection Time: 03/21/18 11:08 AM  Result Value Ref Range   hCG, Beta Chain, Quant, S 21,799 (H) <5 mIU/mL    IMAGING [place] Ob Less Than 14 Weeks With Ob Transvaginal  Addendum Date: 03/21/2018   ADDENDUM REPORT: 03/21/2018 12:52 ADDENDUM: Dictation error in the 1st sentence of the impression. That sentence should read: Single intrauterine gestation with suspected tiny embryo with cardiac activity, although too small to measure. Remainder of report is unchanged. Electronically Signed   By: Charline BillsSriyesh  Krishnan M.D.   On: 03/21/2018 12:52   Result Date: 03/21/2018 CLINICAL DATA:  Pregnant, abdominal pain EXAM: OBSTETRIC <14 WK US AND TRANSVAGINAL OB US TECHNIQUE: Both transabdominal and transvaginal ultrasound examinations were  performed for complete evaluation of the gestation as well as the maternal uterus, adnexal regions, and pelvic cul-de-sac. Transvaginal technique was performed to assess early pregnancy. COMPARISON:  None. FINDINGS: Intrauterine  gestational sac: Single Yolk sac:  Visualized. Embryo: Probable, although difficult to measure. Cardiac Activity: Suspected. MSD: 13.7  mm   6 w   1  d Subchorionic hemorrhage:  None visualized. Maternal uterus/adnexae: Right ovary is within normal limits. Left ovary is notable for a 4.2 x 2.9 x 3.2 cm simple corpus luteal cyst. Trace pelvic fluid. IMPRESSION: Single intrauterine gestation with cysts affected tiny embryo with cardiac activity, although too small to measure. By mean sac diameter, gestational age is 6 weeks 1 day. Consider follow-up pelvic ultrasound in 14 days to confirm viability, as clinically warranted. 4.2 cm simple left corpus luteal cyst, physiologic, corresponding to the site of the patient's pain. Electronically Signed: By: Charline Bills M.D. On: 03/21/2018 12:24    MAU COURSE Orders Placed This Encounter  Procedures  . US OB LESS THAN 14 WEEKS WITH OB TRANSVAGINAL  . Urinalysis, Routine w reflex microscopic  . CBC  . hCG, quantitative, pregnancy  . Pregnancy, urine POC  . Discharge patient   No orders of the defined types were placed in this encounter.   MDM +UPT UA, CBC, ABO/Rh, quant hCG, and [place] today to rule out ectopic pregnancy. Vaginal swabs deferred as patient had recent negative results & declines exam.  Ultrasound shows IUGS w/YS and possible small fetal pole. Left CLC.   ASSESSMENT 1. Normal IUP (intrauterine pregnancy) on prenatal ultrasound, first trimester   2. Abdominal pain during pregnancy in first trimester   3. Left ovarian cyst     PLAN Discharge home in stable condition. SAB precautions Start prenatal care Discussed reasons to return to MAU  Allergies as of 03/21/2018      Reactions   Latex Other (See  Comments)   Irritation       Medication List    STOP taking these medications   ibuprofen 800 MG tablet Commonly known as:  ADVIL,MOTRIN   metroNIDAZOLE 500 MG tablet Commonly known as:  FLAGYL     TAKE these medications   PRENATAL PO Take 1 tablet by mouth daily.        Judeth Horn, NP 03/21/2018  5:27 PM

## 2018-03-21 NOTE — MAU Note (Signed)
Pt presents to mau w/co of lower left abd pain she rates 7/10. Pt states she had 3 positive pregnancy tests at home. Pt states she is nervous bc she has had two other miscarriages. Denies bleeding and burning with urination.

## 2018-03-21 NOTE — Discharge Instructions (Signed)
[facility] ° ° °[facility]       Phone: [phone] ° °[facility] Phone: [phone] ° °[facility] [place]  Phone: [phone] ° °[facility] [place] [facility]  Phone: [phone] ° °[facility] [place]  Phone: [phone] ° °[facility]       Phone: [phone] ° °[facility]    Phone: [phone]  ° °[facility]    Phone: [phone] ° °[facility]    Phone: [phone] ° °[facility]    Phone: [phone]  ° °[facility]  Phone: [phone] ° °[facility]    Phone: [phone] ° °[facility]    Phone: [phone] ° °[facility]    Phone: [phone] ° ° ° ° °First Trimester of Pregnancy °The first trimester of pregnancy is from week 1 until the end of week 13 (months 1 through 3). During this time, your baby will begin to develop inside you. At 6-8 weeks, the eyes and face are formed, and the heartbeat can be seen on ultrasound. At the end of 12 weeks, all the baby's organs are formed. Prenatal care is all the medical care you receive before the birth of your baby. Make sure you get good prenatal care and follow all of your doctor's instructions. °Follow these instructions at home: °Medicines °· Take over-the-counter and prescription medicines only as told by your doctor. Some medicines are safe and some medicines are not safe during pregnancy. °· Take a prenatal vitamin that contains at least 600 micrograms (mcg) of folic acid. °· If you have trouble pooping (constipation), take medicine that will make your stool soft (stool softener) if your doctor approves. °Eating and drinking °· Eat regular, healthy meals. °· Your doctor will tell you  the amount of weight gain that is right for you. °· Avoid raw meat and uncooked cheese. °· If you feel sick to your stomach (nauseous) or throw up (vomit): °? Eat 4 or 5 small meals a day instead of 3 large meals. °? Try eating a few soda crackers. °? Drink liquids between meals instead of during meals. °· To prevent constipation: °? Eat foods that are high in fiber, like fresh fruits and vegetables, whole grains, and beans. °? Drink enough fluids to keep your pee (urine) clear or pale yellow. °Activity °· Exercise only as told by your doctor. Stop exercising if you have cramps or pain in your lower belly (abdomen) or low back. °· Do not exercise if it is too hot, too humid, or if you are in a place of great height (high altitude). °· Try to avoid standing for long periods of time. Move your legs often if you must stand in one place for a long time. °· Avoid heavy lifting. °· Wear low-heeled shoes. Sit and stand up straight. °· You can have sex unless your doctor tells you not to. °Relieving pain and discomfort °· Wear a good support bra if your breasts are sore. °· Take warm water baths (sitz baths) to soothe pain or discomfort caused by hemorrhoids. Use hemorrhoid cream if your doctor says it is okay. °· Rest with your legs raised if you have leg cramps or low back pain. °· If you have puffy, bulging veins (varicose veins) in your legs: °? Wear support hose or compression stockings as   told by your doctor. °? Raise (elevate) your feet for 15 minutes, 3-4 times a day. °? Limit salt in your food. °Prenatal care °· Schedule your prenatal visits by the twelfth week of pregnancy. °· Write down your questions. Take them to your prenatal visits. °· Keep all your prenatal visits as told by your doctor. This is important. °Safety °· Wear your seat belt at all times when driving. °· Make a list of emergency phone numbers. The list should include numbers for family, friends, the hospital, and police and fire  departments. °General instructions °· Ask your doctor for a referral to a local prenatal class. Begin classes no later than at the start of month 6 of your pregnancy. °· Ask for help if you need counseling or if you need help with nutrition. Your doctor can give you advice or tell you where to go for help. °· Do not use hot tubs, steam rooms, or saunas. °· Do not douche or use tampons or scented sanitary pads. °· Do not cross your legs for long periods of time. °· Avoid all herbs and alcohol. Avoid drugs that are not approved by your doctor. °· Do not use any tobacco products, including cigarettes, chewing tobacco, and electronic cigarettes. If you need help quitting, ask your doctor. You may get counseling or other support to help you quit. °· Avoid cat litter boxes and soil used by cats. These carry germs that can cause birth defects in the baby and can cause a loss of your baby (miscarriage) or stillbirth. °· Visit your dentist. At home, brush your teeth with a soft toothbrush. Be gentle when you floss. °Contact a doctor if: °· You are dizzy. °· You have mild cramps or pressure in your lower belly. °· You have a nagging pain in your belly area. °· You continue to feel sick to your stomach, you throw up, or you have watery poop (diarrhea). °· You have a bad smelling fluid coming from your vagina. °· You have pain when you pee (urinate). °· You have increased puffiness (swelling) in your face, hands, legs, or ankles. °Get help right away if: °· You have a fever. °· You are leaking fluid from your vagina. °· You have spotting or bleeding from your vagina. °· You have very bad belly cramping or pain. °· You gain or lose weight rapidly. °· You throw up blood. It may look like coffee grounds. °· You are around people who have German measles, fifth disease, or chickenpox. °· You have a very bad headache. °· You have shortness of breath. °· You have any kind of trauma, such as from a fall or a car  accident. °Summary °· The first trimester of pregnancy is from week 1 until the end of week 13 (months 1 through 3). °· To take care of yourself and your unborn baby, you will need to eat healthy meals, take medicines only if your doctor tells you to do so, and do activities that are safe for you and your baby. °· Keep all follow-up visits as told by your doctor. This is important as your doctor will have to ensure that your baby is healthy and growing well. °This information is not intended to replace advice given to you by your health care provider. Make sure you discuss any questions you have with your health care provider. °Document Released: 09/14/2007 Document Revised: 04/05/2016 Document Reviewed: 04/05/2016 °Elsevier Interactive Patient Education © 2017 [employer] ° °

## 2018-03-21 NOTE — MAU Note (Signed)
Urine sent to lab 

## 2018-04-03 ENCOUNTER — Inpatient Hospital Stay (HOSPITAL_COMMUNITY): Payer: Medicaid Other

## 2018-04-03 ENCOUNTER — Inpatient Hospital Stay (HOSPITAL_COMMUNITY)
Admission: AD | Admit: 2018-04-03 | Discharge: 2018-04-03 | Disposition: A | Discharge status: Home / Self Care | Payer: Medicaid Other | Attending: Obstetrics and Gynecology | Admitting: Obstetrics and Gynecology

## 2018-04-03 ENCOUNTER — Encounter (HOSPITAL_COMMUNITY): Payer: Self-pay

## 2018-04-03 DIAGNOSIS — O468X1 Other antepartum hemorrhage, first trimester: Secondary | ICD-10-CM

## 2018-04-03 DIAGNOSIS — O209 Hemorrhage in early pregnancy, unspecified: Secondary | ICD-10-CM | POA: Diagnosis not present

## 2018-04-03 DIAGNOSIS — O418X10 Other specified disorders of amniotic fluid and membranes, first trimester, not applicable or unspecified: Secondary | ICD-10-CM

## 2018-04-03 DIAGNOSIS — R109 Unspecified abdominal pain: Secondary | ICD-10-CM | POA: Diagnosis present

## 2018-04-03 DIAGNOSIS — N939 Abnormal uterine and vaginal bleeding, unspecified: Secondary | ICD-10-CM

## 2018-04-03 DIAGNOSIS — Z87891 Personal history of nicotine dependence: Secondary | ICD-10-CM | POA: Insufficient documentation

## 2018-04-03 DIAGNOSIS — Z3A01 Less than 8 weeks gestation of pregnancy: Secondary | ICD-10-CM | POA: Diagnosis not present

## 2018-04-03 HISTORY — DX: Chlamydial infection, unspecified: A74.9

## 2018-04-03 LAB — URINALYSIS, ROUTINE W REFLEX MICROSCOPIC
BACTERIA UA: NONE SEEN
BILIRUBIN URINE: NEGATIVE
Glucose, UA: NEGATIVE mg/dL
Ketones, ur: NEGATIVE mg/dL
Leukocytes, UA: NEGATIVE
NITRITE: NEGATIVE
Protein, ur: NEGATIVE mg/dL
Specific Gravity, Urine: 1.012 (ref 1.005–1.030)
pH: 7 (ref 5.0–8.0)

## 2018-04-03 NOTE — MAU Note (Addendum)
Pt arrived EMS. Pt started cramping last night, woke up this morning and saw blood when she wiped, not wearing a pad, Pain 7/10. Didn't take anything for the pain.

## 2018-04-03 NOTE — Discharge Instructions (Signed)
Subchorionic Hematoma ° °A subchorionic hematoma is a gathering of blood between the outer wall of the embryo (chorion) and the inner wall of the womb (uterus). °This condition can cause vaginal bleeding. If they cause little or no vaginal bleeding, early small hematomas usually shrink on their own and do not affect your baby or pregnancy. When bleeding starts later in pregnancy, or if the hematoma is larger or occurs in older pregnant women, the condition may be more serious. Larger hematomas may get bigger, which increases the chances of miscarriage. This condition also increases the risk of: °· Premature separation of the placenta from the uterus. °· Premature (preterm) labor. °· Stillbirth. °What are the causes? °The exact cause of this condition is not known. It occurs when blood is trapped between the placenta and the uterine wall because the placenta has separated from the original site of implantation. °What increases the risk? °You are more likely to develop this condition if: °· You were treated with fertility medicines. °· You conceived through in vitro fertilization (IVF). °What are the signs or symptoms? °Symptoms of this condition include: °· Vaginal spotting or bleeding. °· Contractions of the uterus. These cause abdominal pain. °Sometimes you may have no symptoms and the bleeding may only be seen when ultrasound images are taken (transvaginal ultrasound). °How is this diagnosed? °This condition is diagnosed based on a physical exam. This includes a pelvic exam. You may also have other tests, including: °· Blood tests. °· Urine tests. °· Ultrasound of the abdomen. °How is this treated? °Treatment for this condition can vary. Treatment may include: °· Watchful waiting. You will be monitored closely for any changes in bleeding. During this stage: °? The hematoma may be reabsorbed by the body. °? The hematoma may separate the fluid-filled space containing the embryo (gestational sac) from the wall of the  womb (endometrium). °· Medicines. °· Activity restriction. This may be needed until the bleeding stops. °Follow these instructions at home: °· Stay on bed rest if told to do so by your health care provider. °· Do not lift anything that is heavier than 10 lbs. (4.5 kg) or as told by your health care provider. °· Do not use any products that contain nicotine or tobacco, such as cigarettes and e-cigarettes. If you need help quitting, ask your health care provider. °· Track and write down the number of pads you use each day and how soaked (saturated) they are. °· Do not use tampons. °· Keep all follow-up visits as told by your health care provider. This is important. Your health care provider may ask you to have follow-up blood tests or ultrasound tests or both. °Contact a health care provider if: °· You have any vaginal bleeding. °· You have a fever. °Get help right away if: °· You have severe cramps in your stomach, back, abdomen, or pelvis. °· You pass large clots or tissue. Save any tissue for your health care provider to look at. °· You have more vaginal bleeding, and you faint or become lightheaded or weak. °Summary °· A subchorionic hematoma is a gathering of blood between the outer wall of the placenta and the uterus. °· This condition can cause vaginal bleeding. °· Sometimes you may have no symptoms and the bleeding may only be seen when ultrasound images are taken. °· Treatment may include watchful waiting, medicines, or activity restriction. °This information is not intended to replace advice given to you by your health care provider. Make sure you discuss any questions you   have with your health care provider. °Document Released: 07/13/2006 Document Revised: 05/24/2016 Document Reviewed: 05/24/2016 °Elsevier Interactive Patient Education © 2019 [employer] ° °

## 2018-04-03 NOTE — MAU Provider Note (Addendum)
Patient Kristy Little is a 27 y.o.  G4P0030 At 4321w0d here with complaints of bleeding and pain this morning. She denies NV, diarrhea, dysuria, abnormal discharge, UTI symptoms.  She has a history of frequent miscarriages; she came in by EMS when she saw blood on her toilet paper and underwear this morning.  History     CSN: [id number]  Arrival date and time: 04/03/18 [id number]   First Provider Initiated Contact with Patient 04/03/18 1116      Chief Complaint  Patient presents with  . Abdominal Pain  . Vaginal Bleeding   Abdominal Pain  This is a new problem. The current episode started yesterday. The onset quality is gradual. The problem occurs constantly. The pain is located in the suprapubic region and LLQ. The pain is at a severity of 7/10. The quality of the pain is cramping. The abdominal pain does not radiate. Pertinent negatives include no constipation, diarrhea, dysuria or nausea.  Vaginal Bleeding  The patient's primary symptoms include vaginal bleeding. The patient's pertinent negatives include no genital itching, genital lesions or genital odor. This is a new problem. The problem occurs intermittently. The problem has been resolved. Associated symptoms include abdominal pain. Pertinent negatives include no constipation, diarrhea, dysuria or nausea.   She has not had any active bleeding now while in MAU.  OB History    Gravida  4   Para      Term      Preterm      AB  3   Living  0     SAB  3   TAB  0   Ectopic      Multiple      Live Births              Past Medical History:  Diagnosis Date  . Infection    UTI  . Migraine     Past Surgical History:  Procedure Laterality Date  . INDUCED ABORTION      Family History  Problem Relation Age of Onset  . Stroke Mother   . Hypertension Mother   . Diabetes Paternal Aunt     Social History   Tobacco Use  . Smoking status: Former Smoker    Types: Cigarettes  . Smokeless tobacco: Never Used  .  Tobacco comment: 2016  Substance Use Topics  . Alcohol use: No  . Drug use: No    Allergies:  Allergies  Allergen Reactions  . Latex Other (See Comments)    Irritation     Medications Prior to Admission  Medication Sig Dispense Refill Last Dose  . Prenatal Vit-Fe Fumarate-FA (PRENATAL PO) Take 1 tablet by mouth daily.   Taking    Review of Systems  Gastrointestinal: Positive for abdominal pain. Negative for constipation, diarrhea and nausea.  Genitourinary: Positive for vaginal bleeding. Negative for dysuria.   Physical Exam   Blood pressure 112/72, pulse 80, temperature 97.9 F (36.6 C), resp. rate 18, last menstrual period 02/13/2018, SpO2 100 %, unknown if currently breastfeeding.  Physical Exam  Constitutional: She appears well-developed and well-nourished.  HENT:  Head: Normocephalic.  Neck: Normal range of motion.  GI: Soft. There is abdominal tenderness.  Slight tenderness over suprapubic and left adnexal region.  Normal external female genitalia; no lesions on vaginal walls or cervix. No blood in vagina or extruding from cervical os.     MAU Course  Procedures  MDM -US for viability shows living IUP with heartbeat of 145 and small SCH.  -  [facility] and all her testing was negative. She has a NOB visit on January 14. Will defer cultures for today.   -Reviewed blood type, is RH positive.  -Patient's pain is now a 2/10; she was panicking this morning but now she feels better.  Assessment and Plan   1. Subchorionic hemorrhage of placenta in first trimester, single or unspecified fetus   2. Vaginal bleeding    2. Patient stable for discharge with pelvic rest precautions.  [facility]   3. Reviewed warning signs and when to return to MAU; all questions answered.    Kristy Little  04/03/2018, 11:21 AM

## 2018-05-02 ENCOUNTER — Other Ambulatory Visit (HOSPITAL_COMMUNITY): Payer: Self-pay | Admitting: Family

## 2018-05-02 DIAGNOSIS — Z369 Encounter for antenatal screening, unspecified: Secondary | ICD-10-CM

## 2018-05-08 ENCOUNTER — Encounter (HOSPITAL_COMMUNITY): Payer: Self-pay

## 2018-05-15 ENCOUNTER — Ambulatory Visit (HOSPITAL_COMMUNITY)
Admission: RE | Admit: 2018-05-15 | Discharge: 2018-05-15 | Disposition: A | Discharge status: Home / Self Care | Payer: Medicaid Other | Source: Ambulatory Visit | Attending: Family | Admitting: Family

## 2018-05-15 ENCOUNTER — Encounter (HOSPITAL_COMMUNITY): Payer: Self-pay

## 2018-05-15 DIAGNOSIS — Z3682 Encounter for antenatal screening for nuchal translucency: Secondary | ICD-10-CM | POA: Diagnosis not present

## 2018-05-15 DIAGNOSIS — O99011 Anemia complicating pregnancy, first trimester: Secondary | ICD-10-CM

## 2018-05-15 DIAGNOSIS — Z3A13 13 weeks gestation of pregnancy: Secondary | ICD-10-CM | POA: Diagnosis not present

## 2018-05-15 DIAGNOSIS — Z369 Encounter for antenatal screening, unspecified: Secondary | ICD-10-CM | POA: Insufficient documentation

## 2018-05-21 ENCOUNTER — Other Ambulatory Visit (HOSPITAL_COMMUNITY): Payer: Self-pay | Admitting: Family

## 2018-06-02 ENCOUNTER — Inpatient Hospital Stay (HOSPITAL_COMMUNITY)
Admission: AD | Admit: 2018-06-02 | Discharge: 2018-06-02 | Disposition: A | Discharge status: Home / Self Care | Payer: Medicaid Other | Source: Ambulatory Visit | Attending: Obstetrics & Gynecology | Admitting: Obstetrics & Gynecology

## 2018-06-02 ENCOUNTER — Inpatient Hospital Stay (HOSPITAL_BASED_OUTPATIENT_CLINIC_OR_DEPARTMENT_OTHER): Payer: Medicaid Other

## 2018-06-02 ENCOUNTER — Inpatient Hospital Stay (HOSPITAL_COMMUNITY): Payer: Medicaid Other

## 2018-06-02 ENCOUNTER — Encounter (HOSPITAL_COMMUNITY): Payer: Self-pay | Admitting: *Deleted

## 2018-06-02 DIAGNOSIS — R102 Pelvic and perineal pain: Secondary | ICD-10-CM

## 2018-06-02 DIAGNOSIS — Z3A16 16 weeks gestation of pregnancy: Secondary | ICD-10-CM

## 2018-06-02 DIAGNOSIS — O26892 Other specified pregnancy related conditions, second trimester: Secondary | ICD-10-CM | POA: Diagnosis not present

## 2018-06-02 DIAGNOSIS — Z87891 Personal history of nicotine dependence: Secondary | ICD-10-CM | POA: Insufficient documentation

## 2018-06-02 DIAGNOSIS — B9689 Other specified bacterial agents as the cause of diseases classified elsewhere: Secondary | ICD-10-CM | POA: Insufficient documentation

## 2018-06-02 DIAGNOSIS — O23592 Infection of other part of genital tract in pregnancy, second trimester: Secondary | ICD-10-CM | POA: Diagnosis not present

## 2018-06-02 DIAGNOSIS — N760 Acute vaginitis: Secondary | ICD-10-CM

## 2018-06-02 DIAGNOSIS — R109 Unspecified abdominal pain: Secondary | ICD-10-CM | POA: Diagnosis not present

## 2018-06-02 DIAGNOSIS — O9989 Other specified diseases and conditions complicating pregnancy, childbirth and the puerperium: Secondary | ICD-10-CM | POA: Diagnosis not present

## 2018-06-02 DIAGNOSIS — R1030 Lower abdominal pain, unspecified: Secondary | ICD-10-CM | POA: Diagnosis present

## 2018-06-02 LAB — CBC
HCT: 31.4 % — ABNORMAL LOW (ref 36.0–46.0)
Hemoglobin: 10.6 g/dL — ABNORMAL LOW (ref 12.0–15.0)
MCH: 31.4 pg (ref 26.0–34.0)
MCHC: 33.8 g/dL (ref 30.0–36.0)
MCV: 92.9 fL (ref 80.0–100.0)
Platelets: 212 10*3/uL (ref 150–400)
RBC: 3.38 MIL/uL — ABNORMAL LOW (ref 3.87–5.11)
RDW: 12.8 % (ref 11.5–15.5)
WBC: 8.2 10*3/uL (ref 4.0–10.5)
nRBC: 0 % (ref 0.0–0.2)

## 2018-06-02 LAB — URINALYSIS, ROUTINE W REFLEX MICROSCOPIC
Bilirubin Urine: NEGATIVE
GLUCOSE, UA: NEGATIVE mg/dL
Hgb urine dipstick: NEGATIVE
Ketones, ur: NEGATIVE mg/dL
Leukocytes,Ua: NEGATIVE
Nitrite: NEGATIVE
Protein, ur: NEGATIVE mg/dL
Specific Gravity, Urine: 1.025 (ref 1.005–1.030)
pH: 7 (ref 5.0–8.0)

## 2018-06-02 LAB — WET PREP, GENITAL
Sperm: NONE SEEN
Trich, Wet Prep: NONE SEEN
Yeast Wet Prep HPF POC: NONE SEEN

## 2018-06-02 MED ORDER — PRENATAL 27-0.8 MG PO TABS: 1.0000 | ORAL_TABLET | Freq: Every day | ORAL | 10 refills | Status: DC

## 2018-06-02 MED ORDER — METRONIDAZOLE 500 MG PO TABS: 500.0000 mg | ORAL_TABLET | Freq: Two times a day (BID) | ORAL | 0 refills | Status: DC

## 2018-06-02 MED ORDER — OXYCODONE-ACETAMINOPHEN 5-325 MG PO TABS
2.0000 | ORAL_TABLET | Freq: Once | ORAL | Status: AC
  Administered 2018-06-02: 2 via ORAL
  Filled 2018-06-02: qty 2

## 2018-06-02 MED ORDER — OXYCODONE-ACETAMINOPHEN 5-325 MG PO TABS: 1.0000 | ORAL_TABLET | Freq: Four times a day (QID) | ORAL | 0 refills | Status: DC | PRN

## 2018-06-02 NOTE — MAU Provider Note (Addendum)
Chief Complaint: Pelvic Pain   None     SUBJECTIVE HPI: Kristy Little is a 28 y.o. G4P0030 at [redacted]w[redacted]d pt of [facility] who presents to maternity admissions reporting onset of lower abdominal pain yesterday. Her pain is cramping intermittently but constant pressure in her low abdomen with sharp pain radiating down into her vagina.  The pain is gradually worsening since onset. She has not tried any treatments. She is rocking and guarding her abdomen with pain in MAU.  There are no other symptoms. She denies vaginal bleeding, vaginal itching/burning, urinary symptoms, h/a, dizziness, n/v, or fever/chills.     HPI  Past Medical History:  Diagnosis Date  . Chlamydia   . Infection    UTI  . Migraine    Past Surgical History:  Procedure Laterality Date  . INDUCED ABORTION     Social History   Socioeconomic History  . Marital status: Single    Spouse name: Not on file  . Number of children: Not on file  . Years of education: Not on file  . Highest education level: Not on file  Occupational History  . Not on file  Social Needs  . Financial resource strain: Not on file  . Food insecurity:    Worry: Not on file    Inability: Not on file  . Transportation needs:    Medical: Not on file    Non-medical: Not on file  Tobacco Use  . Smoking status: Former Smoker    Types: Cigarettes  . Smokeless tobacco: Never Used  . Tobacco comment: 2016  Substance and Sexual Activity  . Alcohol use: No  . Drug use: No  . Sexual activity: Yes    Birth control/protection: None  Lifestyle  . Physical activity:    Days per week: Not on file    Minutes per session: Not on file  . Stress: Not on file  Relationships  . Social connections:    Talks on phone: Not on file    Gets together: Not on file    Attends religious service: Not on file    Active member of club or organization: Not on file    Attends meetings of clubs or organizations: Not on file    Relationship status: Not on file  .  Intimate partner violence:    Fear of current or ex partner: Not on file    Emotionally abused: Not on file    Physically abused: Not on file    Forced sexual activity: Not on file  Other Topics Concern  . Not on file  Social History Narrative  . Not on file   No current facility-administered medications on file prior to encounter.    Current Outpatient Medications on File Prior to Encounter  Medication Sig Dispense Refill  . Acetaminophen (TYLENOL PO) Take by mouth.    . Prenatal Vit-Fe Fumarate-FA (PRENATAL PO) Take 1 tablet by mouth daily.     Allergies  Allergen Reactions  . Latex Other (See Comments)    Irritation     ROS:  Review of Systems  Constitutional: Negative for chills, fatigue and fever.  Respiratory: Negative for shortness of breath.   Cardiovascular: Negative for chest pain.  Gastrointestinal: Positive for abdominal pain.  Genitourinary: Positive for pelvic pain and vaginal pain. Negative for difficulty urinating, dysuria, flank pain, vaginal bleeding and vaginal discharge.  Neurological: Negative for dizziness and headaches.  Psychiatric/Behavioral: Negative.      I have reviewed patient's Past Medical Hx, Surgical Hx,  Family Hx, Social Hx, medications and allergies.   Physical Exam   Patient Vitals for the past 24 hrs:  BP Temp Pulse Resp Height Weight  06/02/18 1551 (!) 101/59 - 100 - - -  06/02/18 1533 (!) 153/116 97.7 F (36.5 C) 96 18  (1.651 m) 55.3 kg   Constitutional: Well-developed, well-nourished female in no acute distress.  Cardiovascular: normal rate Respiratory: normal effort GI: Abd soft, non-tender. Pos BS x 4 MS: Extremities nontender, no edema, normal ROM Neurologic: Alert and oriented x 4.  GU: Neg CVAT.  PELVIC EXAM: Cervix pink, visually closed, without lesion, moderate thin white discharge, vaginal walls and external genitalia normal Bimanual exam: Cervix 0/long/high, firm, anterior, positive CMT, Tenderness in left  adnexa, no enlargement or mass palpable bilaterally  FHT 147 by doppler  LAB RESULTS Results for orders placed or performed during the hospital encounter of 06/02/18 (from the past 24 hour(s))  Urinalysis, Routine w reflex microscopic     Status: None   Collection Time: 06/02/18  4:17 PM  Result Value Ref Range   Color, Urine YELLOW YELLOW   APPearance CLEAR CLEAR   Specific Gravity, Urine 1.025 1.005 - 1.030   pH 7.0 5.0 - 8.0   Glucose, UA NEGATIVE NEGATIVE mg/dL   Hgb urine dipstick NEGATIVE NEGATIVE   Bilirubin Urine NEGATIVE NEGATIVE   Ketones, ur NEGATIVE NEGATIVE mg/dL   Protein, ur NEGATIVE NEGATIVE mg/dL   Nitrite NEGATIVE NEGATIVE   Leukocytes,Ua NEGATIVE NEGATIVE  CBC     Status: Abnormal   Collection Time: 06/02/18  4:51 PM  Result Value Ref Range   WBC 8.2 4.0 - 10.5 K/uL   RBC 3.38 (L) 3.87 - 5.11 MIL/uL   Hemoglobin 10.6 (L) 12.0 - 15.0 g/dL   HCT 81.1 (L) 91.4 - 78.2 %   MCV 92.9 80.0 - 100.0 fL   MCH 31.4 26.0 - 34.0 pg   MCHC 33.8 30.0 - 36.0 g/dL   RDW 95.6 21.3 - 08.6 %   Platelets 212 150 - 400 K/uL   nRBC 0.0 0.0 - 0.2 %  Wet prep, genital     Status: Abnormal   Collection Time: 06/02/18  4:58 PM  Result Value Ref Range   Yeast Wet Prep HPF POC NONE SEEN NONE SEEN   Trich, Wet Prep NONE SEEN NONE SEEN   Clue Cells Wet Prep HPF POC PRESENT (A) NONE SEEN   WBC, Wet Prep HPF POC FEW (A) NONE SEEN   Sperm NONE SEEN        IMAGING US Pelvis Limited (transabdominal Only)  Result Date: 06/02/2018 CLINICAL DATA:  Fifteen weeks pregnant. Abdominal pain. Question ovarian pathology. EXAM: TRANSABDOMINAL ULTRASOUND OF PELVIS DOPPLER ULTRASOUND OF OVARIES TECHNIQUE: Transabdominal ultrasound examination of the pelvis was performed including evaluation of the uterus, ovaries, adnexal regions, and pelvic cul-de-sac. Color and duplex Doppler ultrasound was utilized to evaluate blood flow to the ovaries. COMPARISON:  None. FINDINGS: Uterus Measurements: Intra  uterine gestation visualized with fetal heart rate 138 beats per minute. Endometrium Thickness: Not assessed due to pregnancy. Right ovary Measurements: 2.4 x 1.6 x 1.4 cm = volume: 1.8 mL. Normal appearance/no adnexal mass. Left ovary Measurements: 3.5 x 1.7 x 2.2 cm = volume: 7.0 mL. Normal appearance/no adnexal mass. Pulsed Doppler evaluation demonstrates normal low-resistance arterial and venous waveforms in both ovaries. Other: No free fluid. IMPRESSION: No ovarian abnormality.  No evidence of torsion. Fetal heart rate 138 beats per minute Electronically Signed   By: Caryn Bee  Dover M.D.   On: 06/02/2018 18:52   US Pelvic Doppler Limited  Result Date: 06/02/2018 CLINICAL DATA:  Fifteen weeks pregnant. Abdominal pain. Question ovarian pathology. EXAM: TRANSABDOMINAL ULTRASOUND OF PELVIS DOPPLER ULTRASOUND OF OVARIES TECHNIQUE: Transabdominal ultrasound examination of the pelvis was performed including evaluation of the uterus, ovaries, adnexal regions, and pelvic cul-de-sac. Color and duplex Doppler ultrasound was utilized to evaluate blood flow to the ovaries. COMPARISON:  None. FINDINGS: Uterus Measurements: Intra uterine gestation visualized with fetal heart rate 138 beats per minute. Endometrium Thickness: Not assessed due to pregnancy. Right ovary Measurements: 2.4 x 1.6 x 1.4 cm = volume: 1.8 mL. Normal appearance/no adnexal mass. Left ovary Measurements: 3.5 x 1.7 x 2.2 cm = volume: 7.0 mL. Normal appearance/no adnexal mass. Pulsed Doppler evaluation demonstrates normal low-resistance arterial and venous waveforms in both ovaries. Other: No free fluid. IMPRESSION: No ovarian abnormality.  No evidence of torsion. Fetal heart rate 138 beats per minute Electronically Signed   By: Charlett Nose M.D.   On: 06/02/2018 18:52   [place] Mfm Fetal Nuchal Translucency  Result Date: 05/15/2018 ----------------------------------------------------------------------  OBSTETRICS REPORT                       (Signed Final  05/15/2018 01:44 pm) ---------------------------------------------------------------------- Patient Info  ID #:       [id number]                          D.O.B.:  [birth year] (27 yrs)  Name:       Kristy Little               Visit Date: 05/15/2018 12:41 pm ---------------------------------------------------------------------- Performed By  Performed By:     Lenise Arena        Ref. Address:     [address] [place],                                                             [place] [address]  Attending:        Noralee Space MD        Location:         [facility]  Referred By:      Marny Lowenstein                    [place] ---------------------------------------------------------------------- Orders   #  Description                          Code         Ordered By   1  [place] MFM FETAL NUCHAL                  [id number]  CONSTANCE      TRANSLUCENCY                                      FOUSHEE  ----------------------------------------------------------------------   #  Order #                    Accession #                 Episode #   1  [id number]                  [id number]                  [id number]  ---------------------------------------------------------------------- Indications   Encounter for nuchal translucency              Z36.82   Anemia during pregnancy in first trimester     O99.011   [redacted] weeks gestation of pregnancy                Z3A.13  ---------------------------------------------------------------------- Vital Signs  Weight (lb): 116                               Height:        5'5"  BMI:         19.3 ---------------------------------------------------------------------- Fetal Evaluation  Num Of Fetuses:         1  Fetal Heart Rate(bpm):  140  Cardiac Activity:       Observed  Amniotic Fluid  AFI FV:      Within normal limits ---------------------------------------------------------------------- Biometry  CRL:       79.6  mm     G. Age:  13w 5d                  EDD:   11/15/18  NT:       1.66  mm ---------------------------------------------------------------------- OB History  Gravidity:    4         Term:   0         SAB:   3  Living:       0 ---------------------------------------------------------------------- Gestational Age  LMP:           13w 0d        Date:  02/13/18                 EDD:   11/20/18  Best:          13w 5d     Det. By:  U/S C R L (05/15/18)     EDD:   11/15/18 ---------------------------------------------------------------------- 1st Trimester Genetic Sonogram Screening  Nuc Trans:       1.7  mm ---------------------------------------------------------------------- Anatomy  Cranium:               Appears normal         Abdominal Wall:         Appears nml (cord  insert, abd wall)  Cavum:                 Appears normal         Cord Vessels:           Appears normal (3                                                                        vessel cord)  Choroid Plexus:        Appears normal         Bladder:                Appears normal  Thoracic:              Appears normal         Upper Extremities:      Appears normal  Stomach:               Appears normal, left   Lower Extremities:      Appears normal                         sided  Abdomen:               Appears normal ---------------------------------------------------------------------- Cervix Uterus Adnexa  Cervix  Normal appearance by transabdominal scan.  Uterus  No abnormality visualized.  Left Ovary  Size(cm)     3.13   x   1.9    x  2.4       Vol(ml): 7.47  Within normal limits.  Right Ovary  Size(cm)     2.52   x   1.57   x  2         Vol(ml): 4.14  Within normal limits.  Cul De Sac  No free fluid seen.  Adnexa  No abnormality visualized. ---------------------------------------------------------------------- Impression  On ultrasound, the CRL measurement is consistent with her   previously-established dates and good fetal heart activity is  seen. The nuchal translucency (NT) measures 1.7  millimeters, which is normal.  Fetal anatomy that could be  ascertained at this gestational age is normal.  Blood was drawn for PAPP-A and beta hCG. We will  communicate first-trimester screening results to the patient. ---------------------------------------------------------------------- Recommendations  Fetal anatomy scan at 20 weeks. ----------------------------------------------------------------------                  Noralee Space, MD Electronically Signed Final Report   05/15/2018 01:44 pm ----------------------------------------------------------------------   MAU Management/MDM: Orders Placed This Encounter  Procedures  . Wet prep, genital  . [place] MFM OB LIMITED  . US PELVIC DOPPLER LIMITED  . US PELVIS LIMITED (TRANSABDOMINAL ONLY)  . Urinalysis, Routine w reflex microscopic  . CBC  . Discharge patient    Meds ordered this encounter  Medications  . oxyCODONE-acetaminophen (PERCOCET/ROXICET) 5-325 MG per tablet 2 tablet  . metroNIDAZOLE (FLAGYL) 500 MG tablet    Sig: Take 1 tablet (500 mg total) by mouth 2 (two) times daily.    Dispense:  14 tablet    Refill:  0    Order Specific Question:   Supervising Provider    Answer:   Adam Phenix [phone]  Pt with positive CMT and left adnexal pain. Pain significant in MAU.  Percocet 5/325 x 2 tabs.  Cervix closed/thick/high so no evidence of preterm labor/miscarriage.  OB and pelvic US ordered.  [place] results wnl.  No evidence of UTI, wet prep with evidence of BV. Will treat with Flagyl, Rx sent to pharmacy.  Pt with cramping pain, resolved with Percocet, returned prior to discharge.  Cervix remains closed, no evidence of labor.  Rx for Percocet 5/325, take 1 Q 6 hours PRN x 6 tabs.  May take ibuprofen PRN in second trimester.  Precautions/reasons to return reviewed.  Pt discharged with strict return precautions.  ASSESSMENT 1.  Bacterial vaginosis   2. Abdominal pain during pregnancy in second trimester   3. Acute pelvic pain, female     PLAN Discharge home Allergies as of 06/02/2018      Reactions   Latex Other (See Comments)   Irritation       Medication List    TAKE these medications   metroNIDAZOLE 500 MG tablet Commonly known as:  FLAGYL Take 1 tablet (500 mg total) by mouth 2 (two) times daily.   PRENATAL PO Take 1 tablet by mouth daily.   TYLENOL PO Take by mouth.      Follow-up Information    Department, [facility] Follow up.   Contact information: [address] [place] [address] [phone]           Sharen Counter Certified Nurse-Midwife 06/02/2018  8:39 PM

## 2018-06-02 NOTE — MAU Note (Signed)
Pt reports she started having pelvic and vaginal pain yesterday. Sharp pain that dose not go away. Difficult to walk.Denies any vag discharge or bleeding.

## 2018-06-04 LAB — CULTURE, OB URINE: Culture: NO GROWTH

## 2018-06-04 LAB — GC/CHLAMYDIA PROBE AMP (~~LOC~~) NOT AT ARMC
CHLAMYDIA, DNA PROBE: NEGATIVE
Neisseria Gonorrhea: NEGATIVE

## 2018-06-05 ENCOUNTER — Inpatient Hospital Stay (HOSPITAL_COMMUNITY)
Admission: AD | Admit: 2018-06-05 | Discharge: 2018-06-05 | Disposition: A | Discharge status: Home / Self Care | Payer: Medicaid Other | Attending: Obstetrics & Gynecology | Admitting: Obstetrics & Gynecology

## 2018-06-05 ENCOUNTER — Other Ambulatory Visit: Payer: Self-pay

## 2018-06-05 ENCOUNTER — Encounter (HOSPITAL_COMMUNITY): Payer: Self-pay | Admitting: *Deleted

## 2018-06-05 DIAGNOSIS — Z3A16 16 weeks gestation of pregnancy: Secondary | ICD-10-CM | POA: Insufficient documentation

## 2018-06-05 DIAGNOSIS — M7918 Myalgia, other site: Secondary | ICD-10-CM

## 2018-06-05 DIAGNOSIS — R1030 Lower abdominal pain, unspecified: Secondary | ICD-10-CM | POA: Insufficient documentation

## 2018-06-05 DIAGNOSIS — O26892 Other specified pregnancy related conditions, second trimester: Secondary | ICD-10-CM | POA: Diagnosis not present

## 2018-06-05 DIAGNOSIS — Z87891 Personal history of nicotine dependence: Secondary | ICD-10-CM | POA: Diagnosis not present

## 2018-06-05 DIAGNOSIS — N949 Unspecified condition associated with female genital organs and menstrual cycle: Secondary | ICD-10-CM

## 2018-06-05 DIAGNOSIS — W000XXA Fall on same level due to ice and snow, initial encounter: Secondary | ICD-10-CM | POA: Insufficient documentation

## 2018-06-05 LAB — CBC
HCT: 32.4 % — ABNORMAL LOW (ref 36.0–46.0)
Hemoglobin: 10.6 g/dL — ABNORMAL LOW (ref 12.0–15.0)
MCH: 30.4 pg (ref 26.0–34.0)
MCHC: 32.7 g/dL (ref 30.0–36.0)
MCV: 92.8 fL (ref 80.0–100.0)
Platelets: 205 10*3/uL (ref 150–400)
RBC: 3.49 MIL/uL — ABNORMAL LOW (ref 3.87–5.11)
RDW: 12.7 % (ref 11.5–15.5)
WBC: 7.7 10*3/uL (ref 4.0–10.5)
nRBC: 0 % (ref 0.0–0.2)

## 2018-06-05 LAB — URINALYSIS, ROUTINE W REFLEX MICROSCOPIC
Bilirubin Urine: NEGATIVE
Glucose, UA: NEGATIVE mg/dL
Hgb urine dipstick: NEGATIVE
Ketones, ur: NEGATIVE mg/dL
NITRITE: NEGATIVE
PROTEIN: NEGATIVE mg/dL
Specific Gravity, Urine: 1.021 (ref 1.005–1.030)
pH: 6 (ref 5.0–8.0)

## 2018-06-05 MED ORDER — CYCLOBENZAPRINE HCL 10 MG PO TABS: 10.0000 mg | ORAL_TABLET | Freq: Three times a day (TID) | ORAL | 0 refills | Status: DC | PRN

## 2018-06-05 MED ORDER — IBUPROFEN 600 MG PO TABS: 600.0000 mg | ORAL_TABLET | Freq: Four times a day (QID) | ORAL | 0 refills | Status: DC | PRN

## 2018-06-05 MED ORDER — IBUPROFEN 600 MG PO TABS
600.0000 mg | ORAL_TABLET | Freq: Four times a day (QID) | ORAL | Status: DC | PRN
  Administered 2018-06-05: 600 mg via ORAL
  Filled 2018-06-05: qty 1

## 2018-06-05 MED ORDER — CYCLOBENZAPRINE HCL 10 MG PO TABS
10.0000 mg | ORAL_TABLET | Freq: Three times a day (TID) | ORAL | Status: DC | PRN
  Administered 2018-06-05: 10 mg via ORAL
  Filled 2018-06-05: qty 1

## 2018-06-05 NOTE — MAU Provider Note (Signed)
History     CSN: [id number]  Arrival date and time: 06/05/18 3335   First Provider Initiated Contact with Patient 06/05/18 1018      Chief Complaint  Patient presents with  . Abdominal Pain   G4P0030 @16 .5 wks presenting with LAP and vaginal pain. Sx started 4 days ago. Pain is constant. Rates 8/10. Was seen 3 days ago in MAU for pain. Was treated for BV, labs and [place] were normal. She took Percocet yesterday but didn't help much. Was also advised to use Ibuprofen but she hasn't taken any. No fevers. No urinary sx. Last BM yesterday. Not feeling FM yet. Reports falling prior to the pain. She slipped on snow and landed on her back. No abdominal trauma.   OB History    Gravida  4   Para      Term      Preterm      AB  3   Living  0     SAB  3   TAB  0   Ectopic      Multiple      Live Births              Past Medical History:  Diagnosis Date  . Chlamydia   . Infection    UTI  . Migraine     Past Surgical History:  Procedure Laterality Date  . INDUCED ABORTION      Family History  Problem Relation Age of Onset  . Stroke Mother   . Hypertension Mother   . Diabetes Paternal Aunt     Social History   Tobacco Use  . Smoking status: Former Smoker    Types: Cigarettes, Cigars  . Smokeless tobacco: Never Used  . Tobacco comment: 2016  Substance Use Topics  . Alcohol use: No  . Drug use: No    Allergies:  Allergies  Allergen Reactions  . Latex Other (See Comments)    Irritation     No medications prior to admission.    Review of Systems  Constitutional: Negative for chills and fever.  Gastrointestinal: Positive for abdominal pain. Negative for constipation, diarrhea, nausea and vomiting.  Genitourinary: Positive for pelvic pain.  Musculoskeletal: Negative for back pain and myalgias.   Physical Exam   Blood pressure 94/77, pulse (!) 105, temperature 98.7 F (37.1 C), temperature source Oral, resp. rate 19, weight 54.4 kg, last  menstrual period 02/13/2018, SpO2 100 %, unknown if currently breastfeeding.  Physical Exam  Nursing note and vitals reviewed. Constitutional: She is oriented to person, place, and time. She appears well-developed and well-nourished. No distress.  HENT:  Head: Normocephalic and atraumatic.  Neck: Normal range of motion.  Respiratory: Effort normal. No respiratory distress.  GI: Soft. She exhibits no distension and no mass. There is abdominal tenderness in the suprapubic area. There is no rebound and no guarding.  Genitourinary:    Genitourinary Comments: SVE closed/long   Musculoskeletal: Normal range of motion.     Cervical back: Normal.     Thoracic back: Normal.     Lumbar back: Normal.  Neurological: She is alert and oriented to person, place, and time.  Skin: Skin is warm and dry.  Psychiatric: She has a normal mood and affect.  FHT 145  Results for orders placed or performed during the hospital encounter of 06/05/18 (from the past 24 hour(s))  Urinalysis, Routine w reflex microscopic     Status: Abnormal   Collection Time: 06/05/18  9:08 AM  Result  Value Ref Range   Color, Urine YELLOW YELLOW   APPearance HAZY (A) CLEAR   Specific Gravity, Urine 1.021 1.005 - 1.030   pH 6.0 5.0 - 8.0   Glucose, UA NEGATIVE NEGATIVE mg/dL   Hgb urine dipstick NEGATIVE NEGATIVE   Bilirubin Urine NEGATIVE NEGATIVE   Ketones, ur NEGATIVE NEGATIVE mg/dL   Protein, ur NEGATIVE NEGATIVE mg/dL   Nitrite NEGATIVE NEGATIVE   Leukocytes,Ua TRACE (A) NEGATIVE   RBC / HPF 0-5 0 - 5 RBC/hpf   WBC, UA 0-5 0 - 5 WBC/hpf   Bacteria, UA RARE (A) NONE SEEN   Squamous Epithelial / LPF 21-50 0 - 5   Mucus PRESENT   CBC     Status: Abnormal   Collection Time: 06/05/18 10:55 AM  Result Value Ref Range   WBC 7.7 4.0 - 10.5 K/uL   RBC 3.49 (L) 3.87 - 5.11 MIL/uL   Hemoglobin 10.6 (L) 12.0 - 15.0 g/dL   HCT 16.1 (L) 09.6 - 04.5 %   MCV 92.8 80.0 - 100.0 fL   MCH 30.4 26.0 - 34.0 pg   MCHC 32.7 30.0 -  36.0 g/dL   RDW 40.9 81.1 - 91.4 %   Platelets 205 150 - 400 K/uL   nRBC 0.0 0.0 - 0.2 %   MAU Course  Procedures Orders Placed This Encounter  Procedures  . Urinalysis, Routine w reflex microscopic    Standing Status:   Standing    Number of Occurrences:   1  . CBC    Standing Status:   Standing    Number of Occurrences:   1  . Discharge patient    Order Specific Question:   Discharge disposition    Answer:   01-Home or Self Care [1]    Order Specific Question:   Discharge patient date    Answer:   06/05/2018   Meds ordered this encounter  Medications  . ibuprofen (ADVIL,MOTRIN) tablet 600 mg  . cyclobenzaprine (FLEXERIL) tablet 10 mg  . ibuprofen (ADVIL,MOTRIN) 600 MG tablet    Sig: Take 1 tablet (600 mg total) by mouth every 6 (six) hours as needed for moderate pain. Do not use in 3rd trimester of pregnancy    Dispense:  20 tablet    Refill:  0    Order Specific Question:   Supervising Provider    Answer:   Adam Phenix [[id number]]  . cyclobenzaprine (FLEXERIL) 10 MG tablet    Sig: Take 1 tablet (10 mg total) by mouth 3 (three) times daily as needed for muscle spasms.    Dispense:  30 tablet    Refill:  0    Order Specific Question:   Supervising Provider    Answer:   Adam Phenix [[id number]]   MDM Labs ordered and reviewed. Pain improved after meds. No evidence of UTI or threatened SAB. Pain likely MSK after fall. Stable for discharge home.    Assessment and Plan   1. [redacted] weeks gestation of pregnancy   2. Musculoskeletal pain   3. Round ligament pain    Discharge home Follow up at [facility] as scheduled SAB precautions Heat prn Rx Flexeril Rx Ibuprofen  Allergies as of 06/05/2018      Reactions   Latex Other (See Comments)   Irritation       Medication List    STOP taking these medications   oxyCODONE-acetaminophen 5-325 MG tablet Commonly known as:  PERCOCET/ROXICET     TAKE these medications   cyclobenzaprine  10 MG tablet Commonly known as:   FLEXERIL Take 1 tablet (10 mg total) by mouth 3 (three) times daily as needed for muscle spasms.   ibuprofen 600 MG tablet Commonly known as:  ADVIL,MOTRIN Take 1 tablet (600 mg total) by mouth every 6 (six) hours as needed for moderate pain. Do not use in 3rd trimester of pregnancy   metroNIDAZOLE 500 MG tablet Commonly known as:  FLAGYL Take 1 tablet (500 mg total) by mouth 2 (two) times daily.   multivitamin-prenatal 27-0.8 MG Tabs tablet Take 1 tablet by mouth daily.   TYLENOL PO Take by mouth.      Donette Larry, CNM 06/05/2018, 3:48 PM

## 2018-06-05 NOTE — Discharge Instructions (Signed)
Musculoskeletal Pain  Musculoskeletal pain refers to aches and pains in your bones, joints, muscles, and the tissues that surround them. This pain can occur in any part of the body. It can last for a short time (acute) or a long time (chronic).  A physical exam, lab tests, and imaging studies may be done to find the cause of your musculoskeletal pain.  Follow these instructions at home:    Lifestyle   Try to control or lower your stress levels. Stress increases muscle tension and can worsen musculoskeletal pain. It is important to recognize when you are anxious or stressed and learn ways to manage it. This may include:  ? Meditation or yoga.  ? Cognitive or behavioral therapy.  ? Acupuncture or massage therapy.   You may continue all activities unless the activities cause more pain. When the pain gets better, slowly resume your normal activities. Gradually increase the intensity and duration of your activities or exercise.  Managing pain, stiffness, and swelling   Take over-the-counter and prescription medicines only as told by your health care provider.   When your pain is severe, bed rest may be helpful. Lie or sit in any position that is comfortable, but get out of bed and walk around at least every couple of hours.   If directed, apply heat to the affected area as often as told by your health care provider. Use the heat source that your health care provider recommends, such as a moist heat pack or a heating pad.  ? Place a towel between your skin and the heat source.  ? Leave the heat on for 20-30 minutes.  ? Remove the heat if your skin turns bright red. This is especially important if you are unable to feel pain, heat, or cold. You may have a greater risk of getting burned.   If directed, put ice on the painful area.  ? Put ice in a plastic bag.  ? Place a towel between your skin and the bag.  ? Leave the ice on for 20 minutes, 2-3 times a day.  General instructions   Your health care provider may  recommend that you see a physical therapist. This person can help you come up with a safe exercise program. Do any exercises as told by your physical therapist.   Keep all follow-up visits, including any physical therapy visits, as told by your health care providers. This is important.  Contact a health care provider if:   Your pain gets worse.   Medicines do not help ease your pain.   You cannot use the part of your body that hurts, such as your arm, leg, or neck.   You have trouble sleeping.   You have trouble doing your normal activities.  Get help right away if:   You have a new injury and your pain is worse or different.   You feel numb or you have tingling in the painful area.  Summary   Musculoskeletal pain refers to aches and pains in your bones, joints, muscles, and the tissues that surround them.   This pain can occur in any part of the body.   Your health care provider may recommend that you see a physical therapist. This person can help you come up with a safe exercise program. Do any exercises as told by your physical therapist.   Lower your stress level. Stress can worsen musculoskeletal pain. Ways to lower stress may include meditation, yoga, cognitive or behavioral therapy, acupuncture, and massage   therapy.  This information is not intended to replace advice given to you by your health care provider. Make sure you discuss any questions you have with your health care provider.  Document Released: 03/28/2005 Document Revised: 04/27/2016 Document Reviewed: 04/27/2016  Elsevier Interactive Patient Education  2019 [employer]

## 2018-06-05 NOTE — MAU Note (Addendum)
Is in pain.  Went to dr's on Sat for for the same thing.  Was told if it didn't stop to come back, has actually gotten worse. Denies GI or GU complaints. Pt unable to sit still. abd soft on palpation

## 2018-07-30 ENCOUNTER — Encounter: Payer: Self-pay | Admitting: *Deleted

## 2018-09-19 ENCOUNTER — Encounter: Payer: Self-pay | Admitting: *Deleted

## 2018-10-10 ENCOUNTER — Encounter (HOSPITAL_COMMUNITY): Payer: Self-pay

## 2018-10-10 ENCOUNTER — Inpatient Hospital Stay (HOSPITAL_COMMUNITY)
Admission: AD | Admit: 2018-10-10 | Discharge: 2018-10-11 | Disposition: A | Discharge status: Home / Self Care | Payer: Medicaid Other | Attending: Obstetrics & Gynecology | Admitting: Obstetrics & Gynecology

## 2018-10-10 ENCOUNTER — Inpatient Hospital Stay (HOSPITAL_BASED_OUTPATIENT_CLINIC_OR_DEPARTMENT_OTHER): Payer: Medicaid Other

## 2018-10-10 DIAGNOSIS — Z3A35 35 weeks gestation of pregnancy: Secondary | ICD-10-CM | POA: Diagnosis not present

## 2018-10-10 DIAGNOSIS — O9989 Other specified diseases and conditions complicating pregnancy, childbirth and the puerperium: Secondary | ICD-10-CM | POA: Diagnosis not present

## 2018-10-10 DIAGNOSIS — Z3A34 34 weeks gestation of pregnancy: Secondary | ICD-10-CM | POA: Diagnosis not present

## 2018-10-10 DIAGNOSIS — Z87891 Personal history of nicotine dependence: Secondary | ICD-10-CM | POA: Insufficient documentation

## 2018-10-10 DIAGNOSIS — O26899 Other specified pregnancy related conditions, unspecified trimester: Secondary | ICD-10-CM

## 2018-10-10 DIAGNOSIS — O4700 False labor before 37 completed weeks of gestation, unspecified trimester: Secondary | ICD-10-CM

## 2018-10-10 DIAGNOSIS — R141 Gas pain: Secondary | ICD-10-CM

## 2018-10-10 DIAGNOSIS — O368330 Maternal care for abnormalities of the fetal heart rate or rhythm, third trimester, not applicable or unspecified: Secondary | ICD-10-CM | POA: Diagnosis not present

## 2018-10-10 DIAGNOSIS — O479 False labor, unspecified: Secondary | ICD-10-CM | POA: Diagnosis not present

## 2018-10-10 DIAGNOSIS — R109 Unspecified abdominal pain: Secondary | ICD-10-CM | POA: Diagnosis not present

## 2018-10-10 DIAGNOSIS — O368390 Maternal care for abnormalities of the fetal heart rate or rhythm, unspecified trimester, not applicable or unspecified: Secondary | ICD-10-CM

## 2018-10-10 LAB — URINALYSIS, ROUTINE W REFLEX MICROSCOPIC
Bilirubin Urine: NEGATIVE
Glucose, UA: 50 mg/dL — AB
Hgb urine dipstick: NEGATIVE
Ketones, ur: NEGATIVE mg/dL
Leukocytes,Ua: NEGATIVE
Nitrite: NEGATIVE
Protein, ur: NEGATIVE mg/dL
Specific Gravity, Urine: 1.013 (ref 1.005–1.030)
pH: 7 (ref 5.0–8.0)

## 2018-10-10 MED ORDER — NIFEDIPINE 10 MG PO CAPS
10.0000 mg | ORAL_CAPSULE | ORAL | Status: AC | PRN
  Administered 2018-10-10 – 2018-10-11 (×4): 10 mg via ORAL
  Filled 2018-10-10 (×4): qty 1

## 2018-10-10 MED ORDER — LACTATED RINGERS IV BOLUS
1000.0000 mL | Freq: Once | INTRAVENOUS | Status: AC
  Administered 2018-10-10: 1000 mL via INTRAVENOUS

## 2018-10-10 NOTE — MAU Note (Signed)
Pt here by EMS with abdominal and vaginal pain, severe. Denies any bleeding or leaking. Reports fetal movement.

## 2018-10-10 NOTE — MAU Provider Note (Signed)
Chief Complaint:  Vaginal Pain and Abdominal Pain   First Provider Initiated Contact with Patient 10/10/18 2305    HPI: Kristy Little is a 28 y.o. G4P0030 at [id number] presents to maternity admissions reporting severe abdominal pain. Pains come and go.  Pain is all over abdomen.  Fetal movement hurts.  Denies accompanying symptoms.. She reports good fetal movement, denies LOF, vaginal bleeding, vaginal itching/burning, urinary symptoms, h/a, dizziness, n/v, diarrhea, constipation or fever/chills.  She denies headache, visual changes or RUQ abdominal pain.  RN Note: Pt here by EMS with abdominal and vaginal pain, severe. Denies any bleeding or leaking. Reports fetal movement  Past Medical History: Past Medical History:  Diagnosis Date  . Chlamydia   . Infection    UTI  . Migraine     Past obstetric history: OB History  Gravida Para Term Preterm AB Living  4       3 0  SAB TAB Ectopic Multiple Live Births  3 0          # Outcome Date GA Lbr Len/2nd Weight Sex Delivery Anes PTL Lv  4 Current           3 SAB 09/11/14 [id number]         2 SAB           1 SAB             Past Surgical History: Past Surgical History:  Procedure Laterality Date  . INDUCED ABORTION      Family History: Family History  Problem Relation Age of Onset  . Stroke Mother   . Hypertension Mother   . Diabetes Paternal Aunt     Social History: Social History   Tobacco Use  . Smoking status: Former Smoker    Types: Cigarettes, Cigars  . Smokeless tobacco: Never Used  . Tobacco comment: 2016  Substance Use Topics  . Alcohol use: No  . Drug use: No    Allergies:  Allergies  Allergen Reactions  . Latex Other (See Comments)    Irritation     Meds:  Medications Prior to Admission  Medication Sig Dispense Refill Last Dose  . Acetaminophen (TYLENOL PO) Take by mouth.     . cyclobenzaprine (FLEXERIL) 10 MG tablet Take 1 tablet (10 mg total) by mouth 3 (three) times daily as needed for muscle  spasms. 30 tablet 0   . ibuprofen (ADVIL,MOTRIN) 600 MG tablet Take 1 tablet (600 mg total) by mouth every 6 (six) hours as needed for moderate pain. Do not use in 3rd trimester of pregnancy 20 tablet 0   . metroNIDAZOLE (FLAGYL) 500 MG tablet Take 1 tablet (500 mg total) by mouth 2 (two) times daily. 14 tablet 0   . Prenatal Vit-Fe Fumarate-FA (MULTIVITAMIN-PRENATAL) 27-0.8 MG TABS tablet Take 1 tablet by mouth daily. 30 each 10     I have reviewed patient's Past Medical Hx, Surgical Hx, Family Hx, Social Hx, medications and allergies.   ROS:  Review of Systems  Constitutional: Negative for chills and fever.  Respiratory: Negative for shortness of breath.   Gastrointestinal: Positive for abdominal distention and abdominal pain. Negative for constipation, diarrhea and nausea.  Genitourinary: Positive for pelvic pain. Negative for difficulty urinating, dysuria, vaginal bleeding and vaginal discharge.  Musculoskeletal: Negative for back pain.   Other systems negative  Physical Exam   Patient Vitals for the past 24 hrs:  BP Temp Temp src Pulse Resp SpO2 Height Weight  10/10/18 2239 106/71 98 F (  36.7 C) Oral 90 18 98 % 5\' 4"  (1.626 m) 64.4 kg   Vitals:   10/10/18 2325 10/11/18 0102 10/11/18 0105 10/11/18 0435  BP: (!) 94/57 101/62 101/62 94/63  Pulse: 96  (!) 108 (!) 104  Resp:    18  Temp:      TempSrc:      SpO2:      Weight:      Height:        Constitutional: Well-developed, well-nourished female in no acute distress, but very uncomfortable.  Fidgeting, moving around with pains.   Cardiovascular: normal rate and rhythm Respiratory: normal effort, clear to auscultation bilaterally GI: Abd soft, diffusely tender, gravid appropriate for gestational age.   No rebound or guarding.  There is some distention noted.  Pain seems to be colicky, not always associated with contractions MS: Extremities nontender, no edema, normal ROM Neurologic: Alert and oriented x 4.  GU: Neg  CVAT.  PELVIC EXAM:  Cervix closed and long.   Vertex is high  FHT:  Baseline 140 , moderate variability, accelerations present, no decelerations Contractions: q 2-3 mins Irregular    Labs: Results for orders placed or performed during the hospital encounter of 10/10/18 (from the past 24 hour(s))  Urinalysis, Routine w reflex microscopic     Status: Abnormal   Collection Time: 10/10/18 10:56 PM  Result Value Ref Range   Color, Urine YELLOW YELLOW   APPearance CLEAR CLEAR   Specific Gravity, Urine 1.013 1.005 - 1.030   pH 7.0 5.0 - 8.0   Glucose, UA 50 (A) NEGATIVE mg/dL   Hgb urine dipstick NEGATIVE NEGATIVE   Bilirubin Urine NEGATIVE NEGATIVE   Ketones, ur NEGATIVE NEGATIVE mg/dL   Protein, ur NEGATIVE NEGATIVE mg/dL   Nitrite NEGATIVE NEGATIVE   Leukocytes,Ua NEGATIVE NEGATIVE  Urine rapid drug screen (hosp performed)     Status: None   Collection Time: 10/10/18 11:27 PM  Result Value Ref Range   Opiates NONE DETECTED NONE DETECTED   Cocaine NONE DETECTED NONE DETECTED   Benzodiazepines NONE DETECTED NONE DETECTED   Amphetamines NONE DETECTED NONE DETECTED   Tetrahydrocannabinol NONE DETECTED NONE DETECTED   Barbiturates NONE DETECTED NONE DETECTED    Imaging:  US ordered due to inordinate amount of pain related to contractions Vertex presentation Placenta posterior to right Normal amniotic fluid No evidence of abruption  MAU Course/MDM: I have ordered labs and reviewed results.   No evidence of UTI/stones/Pyelo.  US negative for abruption. Fetal heart rate pattern reassuring except for isolated variable decels.  No pattern to suggest abruption.   There were contractions in beginning, though patient reported pain even when contractions not occurring.    We treated her with Procardia series which did stop contractions Pain continued however.  Flexeril had been given since pain was also in back This did not relieve the pain After we stopped UCs and did US to rule out  abruption, and Urine was negative for infection, I tried giving her Levsin and Mylicon for assumed GI source of colicky pain.  This did improve the pain though patient states she is still "sore".  I offered to consult with attending MD and she states her pain is better and she just wants to go home.  She admitted she initially was upset because the cramping "felt just like my 3 miscarriages" (all under 8 weeks)   I reviewed her US pictures, discussed signs of labor. She expressed fear of labor, fear of pain, and uncertainty of  knowing what labor feels like.  All these discussed.   NST reviewed, reactive throughout.  Initially there was a couple of isolated variable decels, but they did not recur and long EFM recording was normal and reassuring  Assessment: Single intrauterine pregnancy at [redacted]w[redacted]d Preterm uterine contractions Abdominal and back pain, presumed GI colic in origin No evidence of UTI or stones Normal reassuring ultrasound  Plan: Discharge home Preterm Labor precautions and fetal kick counts Rx Levsin prn for colic pain Rx Mylicon for colic pain Long discussion of labor processes and support available for labor Follow up in Office for prenatal visits and recheck of status  Encouraged to return here or to other Urgent Care/ED if she develops worsening of symptoms, increase in pain, fever, or other concerning symptoms.   Pt stable at time of discharge.  Hansel Feinstein CNM, MSN Certified Nurse-Midwife 10/10/2018 11:25 PM

## 2018-10-11 DIAGNOSIS — O368330 Maternal care for abnormalities of the fetal heart rate or rhythm, third trimester, not applicable or unspecified: Secondary | ICD-10-CM

## 2018-10-11 DIAGNOSIS — R109 Unspecified abdominal pain: Secondary | ICD-10-CM

## 2018-10-11 DIAGNOSIS — Z3A34 34 weeks gestation of pregnancy: Secondary | ICD-10-CM

## 2018-10-11 DIAGNOSIS — O479 False labor, unspecified: Secondary | ICD-10-CM

## 2018-10-11 LAB — RAPID URINE DRUG SCREEN, HOSP PERFORMED
Amphetamines: NOT DETECTED
Barbiturates: NOT DETECTED
Benzodiazepines: NOT DETECTED
Cocaine: NOT DETECTED
Opiates: NOT DETECTED
Tetrahydrocannabinol: NOT DETECTED

## 2018-10-11 MED ORDER — SIMETHICONE 80 MG PO CHEW
80.0000 mg | CHEWABLE_TABLET | Freq: Four times a day (QID) | ORAL | Status: DC | PRN
  Administered 2018-10-11: 80 mg via ORAL
  Filled 2018-10-11: qty 1

## 2018-10-11 MED ORDER — HYOSCYAMINE SULFATE SL 0.125 MG SL SUBL: 1.0000 | SUBLINGUAL_TABLET | Freq: Three times a day (TID) | SUBLINGUAL | 0 refills | Status: DC | PRN

## 2018-10-11 MED ORDER — HYOSCYAMINE SULFATE 0.125 MG SL SUBL
0.2500 mg | SUBLINGUAL_TABLET | Freq: Once | SUBLINGUAL | Status: AC
  Administered 2018-10-11: 03:00:00 0.25 mg via SUBLINGUAL
  Filled 2018-10-11: qty 2

## 2018-10-11 MED ORDER — LACTATED RINGERS IV BOLUS
1000.0000 mL | Freq: Once | INTRAVENOUS | Status: AC
  Administered 2018-10-11: 1000 mL via INTRAVENOUS

## 2018-10-11 MED ORDER — HYOSCYAMINE SULFATE 0.125 MG PO TABS
0.2500 mg | ORAL_TABLET | Freq: Once | ORAL | Status: DC
  Filled 2018-10-11: qty 2

## 2018-10-11 MED ORDER — SIMETHICONE 80 MG PO CHEW: 80.0000 mg | CHEWABLE_TABLET | Freq: Four times a day (QID) | ORAL | 0 refills | Status: DC | PRN

## 2018-10-11 NOTE — Discharge Instructions (Signed)
Abdominal Bloating When you have abdominal bloating, your abdomen may feel full, tight, or painful. It may also look bigger than normal or swollen (distended). Common causes of abdominal bloating include:  Swallowing air.  Constipation.  Problems digesting food.  Eating too much.  Irritable bowel syndrome. This is a condition that affects the large intestine.  Lactose intolerance. This is an inability to digest lactose, a natural sugar in dairy products.  Celiac disease. This is a condition that affects the ability to digest gluten, a protein found in some grains.  Gastroparesis. This is a condition that slows down the movement of food in the stomach and small intestine. It is more common in people with diabetes mellitus.  Gastroesophageal reflux disease (GERD). This is a digestive condition that makes stomach acid flow back into the esophagus.  Urinary retention. This means that the body is holding onto urine, and the bladder cannot be emptied all the way. Follow these instructions at home: Eating and drinking  Avoid eating too much.  Try not to swallow air while talking or eating.  Avoid eating while lying down.  Avoid these foods and drinks: ? Foods that cause gas, such as broccoli, cabbage, cauliflower, and baked beans. ? Carbonated drinks. ? Hard candy. ? Chewing gum. Medicines  Take over-the-counter and prescription medicines only as told by your health care provider.  Take probiotic medicines. These medicines contain live bacteria or yeasts that can help digestion.  Take coated peppermint oil capsules. Activity  Try to exercise regularly. Exercise may help to relieve bloating that is caused by gas and relieve constipation. General instructions  Keep all follow-up visits as told by your health care provider. This is important. Contact a health care provider if:  You have nausea and vomiting.  You have diarrhea.  You have abdominal pain.  You have  unusual weight loss or weight gain.  You have severe pain, and medicines do not help. Get help right away if:  You have severe chest pain.  You have trouble breathing.  You have shortness of breath.  You have trouble urinating.  You have darker urine than normal.  You have blood in your stools or have dark, tarry stools. Summary  Abdominal bloating means that the abdomen is swollen.  Common causes of abdominal bloating are swallowing air, constipation, and problems digesting food.  Avoid eating too much and avoid swallowing air.  Avoid foods that cause gas, carbonated drinks, hard candy, and chewing gum. This information is not intended to replace advice given to you by your health care provider. Make sure you discuss any questions you have with your health care provider. Document Released: 04/29/2016 Document Revised: 07/16/2018 Document Reviewed: 04/29/2016 Elsevier Patient Education  [employer]. Abdominal Pain During Pregnancy  Abdominal pain is common during pregnancy, and has many possible causes. Some causes are more serious than others, and sometimes the cause is not known. Abdominal pain can be a sign that labor is starting. It can also be caused by normal growth and stretching of muscles and ligaments during pregnancy. Always tell your health care provider if you have any abdominal pain. Follow these instructions at home:  Do not have sex or put anything in your vagina until your pain goes away completely.  Get plenty of rest until your pain improves.  Drink enough fluid to keep your urine pale yellow.  Take over-the-counter and prescription medicines only as told by your health care provider.  Keep all follow-up visits as told  by your health care provider. This is important. Contact a health care provider if:  Your pain continues or gets worse after resting.  You have lower abdominal pain that: ? Comes and goes at regular intervals. ? Spreads to your  back. ? Is similar to menstrual cramps.  You have pain or burning when you urinate. Get help right away if:  You have a fever or chills.  You have vaginal bleeding.  You are leaking fluid from your vagina.  You are passing tissue from your vagina.  You have vomiting or diarrhea that lasts for more than 24 hours.  Your baby is moving less than usual.  You feel very weak or faint.  You have shortness of breath.  You develop severe pain in your upper abdomen. Summary  Abdominal pain is common during pregnancy, and has many possible causes.  If you experience abdominal pain during pregnancy, tell your health care provider right away.  Follow your health care provider's home care instructions and keep all follow-up visits as directed. This information is not intended to replace advice given to you by your health care provider. Make sure you discuss any questions you have with your health care provider. Document Released: 03/28/2005 Document Revised: 07/16/2018 Document Reviewed: 06/30/2016 Elsevier Patient Education  2020 [employer]

## 2018-10-25 LAB — OB RESULTS CONSOLE GC/CHLAMYDIA
Chlamydia: NEGATIVE
Gonorrhea: NEGATIVE

## 2018-10-25 LAB — OB RESULTS CONSOLE GBS: GBS: NEGATIVE

## 2018-11-06 ENCOUNTER — Inpatient Hospital Stay (HOSPITAL_COMMUNITY)
Admission: AD | Admit: 2018-11-06 | Discharge: 2018-11-06 | Disposition: A | Discharge status: Home / Self Care | Payer: Medicaid Other | Attending: Family Medicine | Admitting: Family Medicine

## 2018-11-06 ENCOUNTER — Encounter (HOSPITAL_COMMUNITY): Payer: Self-pay

## 2018-11-06 DIAGNOSIS — O471 False labor at or after 37 completed weeks of gestation: Secondary | ICD-10-CM | POA: Insufficient documentation

## 2018-11-06 DIAGNOSIS — O479 False labor, unspecified: Secondary | ICD-10-CM

## 2018-11-06 DIAGNOSIS — Z3A38 38 weeks gestation of pregnancy: Secondary | ICD-10-CM | POA: Diagnosis not present

## 2018-11-06 DIAGNOSIS — Z87891 Personal history of nicotine dependence: Secondary | ICD-10-CM | POA: Insufficient documentation

## 2018-11-06 NOTE — MAU Note (Signed)
FHTs reactive, accels present/category I tracing. Strip reviewed by Hansel Feinstein, CNM.

## 2018-11-06 NOTE — MAU Note (Signed)
Pt here with contractions; pressure in vagina. Was checked last week and was 2 cm. Denies any bleeding or leaking. GBS neg.

## 2018-11-06 NOTE — Discharge Instructions (Signed)
Braxton Hicks Contractions Contractions of the uterus can occur throughout pregnancy, but they are not always a sign that you are in labor. You may have practice contractions called Braxton Hicks contractions. These false labor contractions are sometimes confused with true labor. What are Braxton Hicks contractions? Braxton Hicks contractions are tightening movements that occur in the muscles of the uterus before labor. Unlike true labor contractions, these contractions do not result in opening (dilation) and thinning of the cervix. Toward the end of pregnancy (32-34 weeks), Braxton Hicks contractions can happen more often and may become stronger. These contractions are sometimes difficult to tell apart from true labor because they can be very uncomfortable. You should not feel embarrassed if you go to the hospital with false labor. Sometimes, the only way to tell if you are in true labor is for your health care provider to look for changes in the cervix. The health care provider will do a physical exam and may monitor your contractions. If you are not in true labor, the exam should show that your cervix is not dilating and your water has not broken. If there are no other health problems associated with your pregnancy, it is completely safe for you to be sent home with false labor. You may continue to have Braxton Hicks contractions until you go into true labor. How to tell the difference between true labor and false labor True labor  Contractions last 30-70 seconds.  Contractions become very regular.  Discomfort is usually felt in the top of the uterus, and it spreads to the lower abdomen and low back.  Contractions do not go away with walking.  Contractions usually become more intense and increase in frequency.  The cervix dilates and gets thinner. False labor  Contractions are usually shorter and not as strong as true labor contractions.  Contractions are usually irregular.  Contractions  are often felt in the front of the lower abdomen and in the groin.  Contractions may go away when you walk around or change positions while lying down.  Contractions get weaker and are shorter-lasting as time goes on.  The cervix usually does not dilate or become thin. Follow these instructions at home:   Take over-the-counter and prescription medicines only as told by your health care provider.  Keep up with your usual exercises and follow other instructions from your health care provider.  Eat and drink lightly if you think you are going into labor.  If Braxton Hicks contractions are making you uncomfortable: ? Change your position from lying down or resting to walking, or change from walking to resting. ? Sit and rest in a tub of warm water. ? Drink enough fluid to keep your urine pale yellow. Dehydration may cause these contractions. ? Do slow and deep breathing several times an hour.  Keep all follow-up prenatal visits as told by your health care provider. This is important. Contact a health care provider if:  You have a fever.  You have continuous pain in your abdomen. Get help right away if:  Your contractions become stronger, more regular, and closer together.  You have fluid leaking or gushing from your vagina.  You pass blood-tinged mucus (bloody show).  You have bleeding from your vagina.  You have low back pain that you never had before.  You feel your baby's head pushing down and causing pelvic pressure.  Your baby is not moving inside you as much as it used to. Summary  Contractions that occur before labor are   called Braxton Hicks contractions, false labor, or practice contractions.  Braxton Hicks contractions are usually shorter, weaker, farther apart, and less regular than true labor contractions. True labor contractions usually become progressively stronger and regular, and they become more frequent.  Manage discomfort from Braxton Hicks contractions  by changing position, resting in a warm bath, drinking plenty of water, or practicing deep breathing. This information is not intended to replace advice given to you by your health care provider. Make sure you discuss any questions you have with your health care provider. Document Released: 08/11/2016 Document Revised: 03/10/2017 Document Reviewed: 08/11/2016 Elsevier Patient Education  2020 [employer]  

## 2018-11-06 NOTE — MAU Note (Signed)
Decel noted after prolonged contraction when going in to D/C patient. Called report to Carmelia Roller, CNM, EFM adjusted; maternal position changed and given clear liquids. Will continue to monitor.

## 2018-11-06 NOTE — MAU Note (Signed)
I have communicated with Hansel Feinstein, CNM and reviewed vital signs: 122/81, P 98, Temp 98, RR 20 Vaginal exam:  Dilation: 1 Effacement (%): 60 Cervical Position: Posterior Station: -3 Presentation: Vertex Exam by:: [employer] RN,   Also reviewed contraction pattern and that non-stress test is reactive.  It has been documented that patient is contracting occasionally with no cervical change over 1 hour not indicating active labor.  Patient denies any other complaints.  Based on this report provider has given order for discharge.  A discharge order and diagnosis entered by a provider.   Labor discharge instructions reviewed with patient.

## 2018-11-06 NOTE — MAU Provider Note (Signed)
Chief Complaint:  Contractions   None     HPI: Kristy Little is a 28 y.o. G4P0030 at 91344w0dwho presents to maternity admissions reporting uterine contractions.  Has been monitored for an hour by RN and cervix is unchanged.. She reports good fetal movement, denies LOF, vaginal bleeding, vaginal itching/burning, urinary symptoms, h/a, dizziness, n/v, diarrhea, constipation or fever/chills. .   Past Medical History: Past Medical History:  Diagnosis Date  . Chlamydia   . Infection    UTI  . Migraine     Past obstetric history: OB History  Gravida Para Term Preterm AB Living  4       3 0  SAB TAB Ectopic Multiple Live Births  3 0          # Outcome Date GA Lbr Len/2nd Weight Sex Delivery Anes PTL Lv  4 Current           3 SAB 09/11/14 2844w0d         2 SAB           1 SAB             Past Surgical History: Past Surgical History:  Procedure Laterality Date  . INDUCED ABORTION      Family History: Family History  Problem Relation Age of Onset  . Stroke Mother   . Hypertension Mother   . Diabetes Paternal Aunt     Social History: Social History   Tobacco Use  . Smoking status: Former Smoker    Types: Cigarettes, Cigars  . Smokeless tobacco: Never Used  . Tobacco comment: 2016  Substance Use Topics  . Alcohol use: No  . Drug use: No    Allergies:  Allergies  Allergen Reactions  . Latex Other (See Comments)    Irritation     Meds:  Medications Prior to Admission  Medication Sig Dispense Refill Last Dose  . Prenatal Vit-Fe Fumarate-FA (MULTIVITAMIN-PRENATAL) 27-0.8 MG TABS tablet Take 1 tablet by mouth daily. 30 each 10 11/06/2018 at Unknown time  . Acetaminophen (TYLENOL PO) Take by mouth.     . cyclobenzaprine (FLEXERIL) 10 MG tablet Take 1 tablet (10 mg total) by mouth 3 (three) times daily as needed for muscle spasms. 30 tablet 0   . Hyoscyamine Sulfate SL (LEVSIN/SL) 0.125 MG SUBL Place 1 tablet under the tongue every 8 (eight) hours as needed  (cramping). 15 tablet 0   . metroNIDAZOLE (FLAGYL) 500 MG tablet Take 1 tablet (500 mg total) by mouth 2 (two) times daily. 14 tablet 0   . simethicone (MYLICON) 80 MG chewable tablet Chew 1 tablet (80 mg total) by mouth 4 (four) times daily as needed for flatulence. 30 tablet 0     I have reviewed patient's Past Medical Hx, Surgical Hx, Family Hx, Social Hx, medications and allergies.   ROS:  Review of Systems Other systems negative  Physical Exam   Dilation: 1 Effacement (%): 60 Cervical Position: Posterior Station: -3 Presentation: Vertex Exam by:: PharmacologistAmber Stovall RN  FHT:  Baseline 140 , moderate variability, accelerations present, no decelerations Contractions:  Irregular     Labs: No results found for this or any previous visit (from the past 24 hour(s)).    Imaging:    MAU Course/MDM: NST reviewed and is reactive.   Uterine contractions are mild and irregular Cervix unchanged per RN exam  Treatments in MAU included EFM and Labor eval.    Assessment: Single intrauterine pregnancy at 34344w0d Irregular contractions False labor  Plan: Discharge home Labor precautions and fetal kick counts Follow up in Office for prenatal visits and recheck of status  Encouraged to return here or to other Urgent Care/ED if she develops worsening of symptoms, increase in pain, fever, or other concerning symptoms.   Pt stable at time of discharge.  Hansel Feinstein CNM, MSN Certified Nurse-Midwife 11/06/2018 10:35 PM

## 2018-11-08 ENCOUNTER — Inpatient Hospital Stay (HOSPITAL_COMMUNITY)
Admission: AD | Admit: 2018-11-08 | Discharge: 2018-11-11 | DRG: 786 | Disposition: A | Discharge status: Home / Self Care | Payer: Medicaid Other | Attending: Family Medicine | Admitting: Family Medicine

## 2018-11-08 ENCOUNTER — Other Ambulatory Visit: Payer: Self-pay

## 2018-11-08 ENCOUNTER — Encounter (HOSPITAL_COMMUNITY): Payer: Self-pay | Admitting: *Deleted

## 2018-11-08 DIAGNOSIS — D649 Anemia, unspecified: Secondary | ICD-10-CM | POA: Diagnosis present

## 2018-11-08 DIAGNOSIS — Z3A38 38 weeks gestation of pregnancy: Secondary | ICD-10-CM | POA: Diagnosis not present

## 2018-11-08 DIAGNOSIS — O9902 Anemia complicating childbirth: Secondary | ICD-10-CM | POA: Diagnosis present

## 2018-11-08 DIAGNOSIS — Z720 Tobacco use: Secondary | ICD-10-CM | POA: Diagnosis present

## 2018-11-08 DIAGNOSIS — O99344 Other mental disorders complicating childbirth: Secondary | ICD-10-CM | POA: Diagnosis present

## 2018-11-08 DIAGNOSIS — O411230 Chorioamnionitis, third trimester, not applicable or unspecified: Secondary | ICD-10-CM | POA: Diagnosis present

## 2018-11-08 DIAGNOSIS — Z87891 Personal history of nicotine dependence: Secondary | ICD-10-CM | POA: Diagnosis not present

## 2018-11-08 DIAGNOSIS — F319 Bipolar disorder, unspecified: Secondary | ICD-10-CM | POA: Diagnosis present

## 2018-11-08 DIAGNOSIS — Z20828 Contact with and (suspected) exposure to other viral communicable diseases: Secondary | ICD-10-CM | POA: Diagnosis present

## 2018-11-08 DIAGNOSIS — Z98891 History of uterine scar from previous surgery: Secondary | ICD-10-CM

## 2018-11-08 DIAGNOSIS — O368390 Maternal care for abnormalities of the fetal heart rate or rhythm, unspecified trimester, not applicable or unspecified: Secondary | ICD-10-CM | POA: Diagnosis present

## 2018-11-08 LAB — CBC
HCT: 37.1 % (ref 36.0–46.0)
Hemoglobin: 12.5 g/dL (ref 12.0–15.0)
MCH: 31.8 pg (ref 26.0–34.0)
MCHC: 33.7 g/dL (ref 30.0–36.0)
MCV: 94.4 fL (ref 80.0–100.0)
Platelets: 181 10*3/uL (ref 150–400)
RBC: 3.93 MIL/uL (ref 3.87–5.11)
RDW: 12.7 % (ref 11.5–15.5)
WBC: 10.3 10*3/uL (ref 4.0–10.5)
nRBC: 0 % (ref 0.0–0.2)

## 2018-11-08 MED ORDER — EPHEDRINE 5 MG/ML INJ: 10.0000 mg | INTRAVENOUS | Status: DC | PRN

## 2018-11-08 MED ORDER — LIDOCAINE HCL (PF) 1 % IJ SOLN: 30.0000 mL | INTRAMUSCULAR | Status: DC | PRN

## 2018-11-08 MED ORDER — LACTATED RINGERS IV SOLN
500.0000 mL | INTRAVENOUS | Status: DC | PRN
  Administered 2018-11-09: 15:00:00 500 mL via INTRAVENOUS

## 2018-11-08 MED ORDER — LACTATED RINGERS IV SOLN
500.0000 mL | Freq: Once | INTRAVENOUS | Status: AC
  Administered 2018-11-08: 500 mL via INTRAVENOUS

## 2018-11-08 MED ORDER — OXYTOCIN 40 UNITS IN NORMAL SALINE INFUSION - SIMPLE MED: 2.5000 [IU]/h | INTRAVENOUS | Status: DC

## 2018-11-08 MED ORDER — OXYTOCIN BOLUS FROM INFUSION: 500.0000 mL | Freq: Once | INTRAVENOUS | Status: DC

## 2018-11-08 MED ORDER — LACTATED RINGERS IV SOLN
INTRAVENOUS | Status: DC
  Administered 2018-11-09 (×2): via INTRAVENOUS

## 2018-11-08 MED ORDER — DIPHENHYDRAMINE HCL 50 MG/ML IJ SOLN: 12.5000 mg | INTRAMUSCULAR | Status: DC | PRN

## 2018-11-08 MED ORDER — OXYCODONE-ACETAMINOPHEN 5-325 MG PO TABS: 2.0000 | ORAL_TABLET | ORAL | Status: DC | PRN

## 2018-11-08 MED ORDER — ONDANSETRON HCL 4 MG/2ML IJ SOLN
4.0000 mg | Freq: Four times a day (QID) | INTRAMUSCULAR | Status: DC | PRN
  Administered 2018-11-09: 4 mg via INTRAVENOUS

## 2018-11-08 MED ORDER — OXYCODONE-ACETAMINOPHEN 5-325 MG PO TABS
1.0000 | ORAL_TABLET | Freq: Once | ORAL | Status: AC
  Administered 2018-11-08: 1 via ORAL
  Filled 2018-11-08: qty 1

## 2018-11-08 MED ORDER — PHENYLEPHRINE 40 MCG/ML (10ML) SYRINGE FOR IV PUSH (FOR BLOOD PRESSURE SUPPORT): 80.0000 ug | PREFILLED_SYRINGE | INTRAVENOUS | Status: DC | PRN

## 2018-11-08 MED ORDER — ACETAMINOPHEN 325 MG PO TABS
650.0000 mg | ORAL_TABLET | ORAL | Status: DC | PRN
  Filled 2018-11-08: qty 2

## 2018-11-08 MED ORDER — FENTANYL-BUPIVACAINE-NACL 0.5-0.125-0.9 MG/250ML-% EP SOLN
12.0000 mL/h | EPIDURAL | Status: DC | PRN
  Filled 2018-11-08: qty 250

## 2018-11-08 MED ORDER — SOD CITRATE-CITRIC ACID 500-334 MG/5ML PO SOLN
30.0000 mL | ORAL | Status: DC | PRN
  Administered 2018-11-09: 30 mL via ORAL
  Filled 2018-11-08: qty 30

## 2018-11-08 MED ORDER — OXYCODONE-ACETAMINOPHEN 5-325 MG PO TABS: 1.0000 | ORAL_TABLET | ORAL | Status: DC | PRN

## 2018-11-08 MED ORDER — PHENYLEPHRINE 40 MCG/ML (10ML) SYRINGE FOR IV PUSH (FOR BLOOD PRESSURE SUPPORT)
80.0000 ug | PREFILLED_SYRINGE | INTRAVENOUS | Status: DC | PRN
  Filled 2018-11-08: qty 10

## 2018-11-08 NOTE — MAU Note (Signed)
Covid swab obtained without difficulty and pt tol well. No symptoms 

## 2018-11-08 NOTE — MAU Note (Signed)
Pt reports to MAU c/o ctx every 1-2 min. No bleeding or LOF. +FM.  

## 2018-11-09 ENCOUNTER — Other Ambulatory Visit: Payer: Self-pay

## 2018-11-09 ENCOUNTER — Encounter (HOSPITAL_COMMUNITY): Payer: Self-pay | Admitting: Anesthesiology

## 2018-11-09 ENCOUNTER — Encounter (HOSPITAL_COMMUNITY)
Admission: AD | Disposition: A | Discharge status: Home / Self Care | Payer: Self-pay | Source: Home / Self Care | Attending: Family Medicine

## 2018-11-09 ENCOUNTER — Inpatient Hospital Stay (HOSPITAL_COMMUNITY): Payer: Medicaid Other | Admitting: Anesthesiology

## 2018-11-09 DIAGNOSIS — Z3A38 38 weeks gestation of pregnancy: Secondary | ICD-10-CM

## 2018-11-09 DIAGNOSIS — Z98891 History of uterine scar from previous surgery: Secondary | ICD-10-CM

## 2018-11-09 LAB — ABO/RH: ABO/RH(D): B POS

## 2018-11-09 LAB — TYPE AND SCREEN
ABO/RH(D): B POS
Antibody Screen: NEGATIVE

## 2018-11-09 LAB — SARS CORONAVIRUS 2 BY RT PCR (HOSPITAL ORDER, PERFORMED IN ~~LOC~~ HOSPITAL LAB): SARS Coronavirus 2: NEGATIVE

## 2018-11-09 LAB — HIV ANTIBODY (ROUTINE TESTING W REFLEX): HIV Screen 4th Generation wRfx: NONREACTIVE

## 2018-11-09 LAB — RPR: RPR Ser Ql: NONREACTIVE

## 2018-11-09 SURGERY — Surgical Case
Anesthesia: Epidural

## 2018-11-09 MED ORDER — GENTAMICIN SULFATE 40 MG/ML IJ SOLN
5.0000 mg/kg | INTRAVENOUS | Status: DC
  Administered 2018-11-09: 17:00:00 330 mg via INTRAVENOUS
  Filled 2018-11-09: qty 8.25

## 2018-11-09 MED ORDER — OXYCODONE HCL 5 MG PO TABS: 5.0000 mg | ORAL_TABLET | Freq: Once | ORAL | Status: DC | PRN

## 2018-11-09 MED ORDER — LIDOCAINE-EPINEPHRINE (PF) 2 %-1:200000 IJ SOLN
INTRAMUSCULAR | Status: AC
  Filled 2018-11-09: qty 10

## 2018-11-09 MED ORDER — ONDANSETRON HCL 4 MG/2ML IJ SOLN
INTRAMUSCULAR | Status: AC
  Filled 2018-11-09: qty 2

## 2018-11-09 MED ORDER — COCONUT OIL OIL: 1.0000 "application " | TOPICAL_OIL | Status: DC | PRN

## 2018-11-09 MED ORDER — NALBUPHINE HCL 10 MG/ML IJ SOLN: 5.0000 mg | INTRAMUSCULAR | Status: DC | PRN

## 2018-11-09 MED ORDER — ENOXAPARIN SODIUM 40 MG/0.4ML ~~LOC~~ SOLN
40.0000 mg | SUBCUTANEOUS | Status: DC
  Filled 2018-11-09: qty 0.4

## 2018-11-09 MED ORDER — NALOXONE HCL 4 MG/10ML IJ SOLN
1.0000 ug/kg/h | INTRAVENOUS | Status: DC | PRN
  Filled 2018-11-09: qty 5

## 2018-11-09 MED ORDER — LIDOCAINE-EPINEPHRINE (PF) 2 %-1:200000 IJ SOLN
INTRAMUSCULAR | Status: DC | PRN
  Administered 2018-11-09 (×3): 3 mL via EPIDURAL
  Administered 2018-11-09: 2 mL via EPIDURAL
  Administered 2018-11-09: 5 mL via EPIDURAL
  Administered 2018-11-09: 3 mL via EPIDURAL
  Administered 2018-11-09: 5 mL via EPIDURAL
  Administered 2018-11-09: 2 mL via EPIDURAL

## 2018-11-09 MED ORDER — SENNOSIDES-DOCUSATE SODIUM 8.6-50 MG PO TABS
2.0000 | ORAL_TABLET | ORAL | Status: DC
  Administered 2018-11-10 (×2): 2 via ORAL
  Filled 2018-11-09 (×2): qty 2

## 2018-11-09 MED ORDER — SODIUM CHLORIDE 0.9 % IV SOLN
INTRAVENOUS | Status: DC | PRN
  Administered 2018-11-09: 20:00:00 via INTRAVENOUS

## 2018-11-09 MED ORDER — SODIUM CHLORIDE 0.9 % IR SOLN
Status: DC | PRN
  Administered 2018-11-09: 1000 mL

## 2018-11-09 MED ORDER — FENTANYL CITRATE (PF) 100 MCG/2ML IJ SOLN
INTRAMUSCULAR | Status: DC | PRN
  Administered 2018-11-09: 100 ug via EPIDURAL

## 2018-11-09 MED ORDER — TERBUTALINE SULFATE 1 MG/ML IJ SOLN: 0.2500 mg | Freq: Once | INTRAMUSCULAR | Status: DC | PRN

## 2018-11-09 MED ORDER — SIMETHICONE 80 MG PO CHEW
80.0000 mg | CHEWABLE_TABLET | ORAL | Status: DC
  Administered 2018-11-10 (×2): 80 mg via ORAL
  Filled 2018-11-09 (×2): qty 1

## 2018-11-09 MED ORDER — SCOPOLAMINE 1 MG/3DAYS TD PT72
MEDICATED_PATCH | TRANSDERMAL | Status: DC | PRN
  Administered 2018-11-09: 1 via TRANSDERMAL

## 2018-11-09 MED ORDER — DIPHENHYDRAMINE HCL 25 MG PO CAPS: 25.0000 mg | ORAL_CAPSULE | Freq: Four times a day (QID) | ORAL | Status: DC | PRN

## 2018-11-09 MED ORDER — KETOROLAC TROMETHAMINE 30 MG/ML IJ SOLN
INTRAMUSCULAR | Status: AC
  Filled 2018-11-09: qty 1

## 2018-11-09 MED ORDER — DIPHENHYDRAMINE HCL 50 MG/ML IJ SOLN: 12.5000 mg | INTRAMUSCULAR | Status: DC | PRN

## 2018-11-09 MED ORDER — SODIUM CHLORIDE 0.9 % IV SOLN
INTRAVENOUS | Status: AC
  Filled 2018-11-09: qty 500

## 2018-11-09 MED ORDER — GABAPENTIN 100 MG PO CAPS
100.0000 mg | ORAL_CAPSULE | Freq: Two times a day (BID) | ORAL | Status: DC
  Administered 2018-11-10 – 2018-11-11 (×4): 100 mg via ORAL
  Filled 2018-11-09 (×4): qty 1

## 2018-11-09 MED ORDER — MORPHINE SULFATE (PF) 0.5 MG/ML IJ SOLN
INTRAMUSCULAR | Status: DC | PRN
  Administered 2018-11-09: 3 mg via EPIDURAL

## 2018-11-09 MED ORDER — ACETAMINOPHEN 500 MG PO TABS
1000.0000 mg | ORAL_TABLET | Freq: Once | ORAL | Status: AC
  Administered 2018-11-09: 15:00:00 1000 mg via ORAL
  Filled 2018-11-09: qty 2

## 2018-11-09 MED ORDER — OXYCODONE HCL 5 MG PO TABS
5.0000 mg | ORAL_TABLET | ORAL | Status: DC | PRN
  Administered 2018-11-10: 10 mg via ORAL
  Administered 2018-11-10: 5 mg via ORAL
  Filled 2018-11-09: qty 2
  Filled 2018-11-09: qty 1

## 2018-11-09 MED ORDER — WITCH HAZEL-GLYCERIN EX PADS: 1.0000 "application " | MEDICATED_PAD | CUTANEOUS | Status: DC | PRN

## 2018-11-09 MED ORDER — CEFAZOLIN SODIUM-DEXTROSE 2-4 GM/100ML-% IV SOLN
INTRAVENOUS | Status: AC
  Filled 2018-11-09: qty 100

## 2018-11-09 MED ORDER — KETOROLAC TROMETHAMINE 30 MG/ML IJ SOLN: 30.0000 mg | Freq: Four times a day (QID) | INTRAMUSCULAR | Status: DC | PRN

## 2018-11-09 MED ORDER — DIPHENHYDRAMINE HCL 25 MG PO CAPS
25.0000 mg | ORAL_CAPSULE | ORAL | Status: DC | PRN
  Administered 2018-11-10: 25 mg via ORAL
  Filled 2018-11-09: qty 1

## 2018-11-09 MED ORDER — SCOPOLAMINE 1 MG/3DAYS TD PT72
MEDICATED_PATCH | TRANSDERMAL | Status: AC
  Filled 2018-11-09: qty 1

## 2018-11-09 MED ORDER — FENTANYL CITRATE (PF) 100 MCG/2ML IJ SOLN
INTRAMUSCULAR | Status: AC
  Filled 2018-11-09: qty 2

## 2018-11-09 MED ORDER — IBUPROFEN 800 MG PO TABS: 800.0000 mg | ORAL_TABLET | Freq: Four times a day (QID) | ORAL | Status: DC

## 2018-11-09 MED ORDER — OXYCODONE HCL 5 MG/5ML PO SOLN: 5.0000 mg | Freq: Once | ORAL | Status: DC | PRN

## 2018-11-09 MED ORDER — OXYTOCIN 40 UNITS IN NORMAL SALINE INFUSION - SIMPLE MED
1.0000 m[IU]/min | INTRAVENOUS | Status: DC
  Administered 2018-11-09: 17:00:00 8 m[IU]/min via INTRAVENOUS
  Administered 2018-11-09: 2 m[IU]/min via INTRAVENOUS
  Filled 2018-11-09: qty 1000

## 2018-11-09 MED ORDER — ACETAMINOPHEN 500 MG PO TABS
1000.0000 mg | ORAL_TABLET | Freq: Four times a day (QID) | ORAL | Status: DC
  Administered 2018-11-10 (×4): 1000 mg via ORAL
  Filled 2018-11-09 (×5): qty 2

## 2018-11-09 MED ORDER — DEXAMETHASONE SODIUM PHOSPHATE 10 MG/ML IJ SOLN
INTRAMUSCULAR | Status: AC
  Filled 2018-11-09: qty 1

## 2018-11-09 MED ORDER — SCOPOLAMINE 1 MG/3DAYS TD PT72: 1.0000 | MEDICATED_PATCH | Freq: Once | TRANSDERMAL | Status: DC

## 2018-11-09 MED ORDER — OXYTOCIN 40 UNITS IN NORMAL SALINE INFUSION - SIMPLE MED
INTRAVENOUS | Status: AC
  Filled 2018-11-09: qty 1000

## 2018-11-09 MED ORDER — SIMETHICONE 80 MG PO CHEW: 80.0000 mg | CHEWABLE_TABLET | ORAL | Status: DC | PRN

## 2018-11-09 MED ORDER — SODIUM CHLORIDE (PF) 0.9 % IJ SOLN
INTRAMUSCULAR | Status: DC | PRN
  Administered 2018-11-09: 12 mL/h via EPIDURAL

## 2018-11-09 MED ORDER — LACTATED RINGERS IV SOLN: INTRAVENOUS | Status: DC

## 2018-11-09 MED ORDER — ONDANSETRON HCL 4 MG/2ML IJ SOLN: 4.0000 mg | Freq: Three times a day (TID) | INTRAMUSCULAR | Status: DC | PRN

## 2018-11-09 MED ORDER — KETOROLAC TROMETHAMINE 30 MG/ML IJ SOLN
30.0000 mg | Freq: Four times a day (QID) | INTRAMUSCULAR | Status: AC
  Administered 2018-11-10 (×2): 30 mg via INTRAVENOUS
  Filled 2018-11-09 (×2): qty 1

## 2018-11-09 MED ORDER — TETANUS-DIPHTH-ACELL PERTUSSIS 5-2.5-18.5 LF-MCG/0.5 IM SUSP: 0.5000 mL | Freq: Once | INTRAMUSCULAR | Status: DC

## 2018-11-09 MED ORDER — KETOROLAC TROMETHAMINE 30 MG/ML IJ SOLN: 30.0000 mg | Freq: Four times a day (QID) | INTRAMUSCULAR | Status: DC

## 2018-11-09 MED ORDER — KETOROLAC TROMETHAMINE 30 MG/ML IJ SOLN
30.0000 mg | Freq: Four times a day (QID) | INTRAMUSCULAR | Status: DC | PRN
  Administered 2018-11-09: 30 mg via INTRAMUSCULAR

## 2018-11-09 MED ORDER — IBUPROFEN 800 MG PO TABS
800.0000 mg | ORAL_TABLET | Freq: Four times a day (QID) | ORAL | Status: DC
  Administered 2018-11-10 – 2018-11-11 (×4): 800 mg via ORAL
  Filled 2018-11-09 (×4): qty 1

## 2018-11-09 MED ORDER — BUPIVACAINE HCL (PF) 0.25 % IJ SOLN
INTRAMUSCULAR | Status: AC
  Filled 2018-11-09: qty 20

## 2018-11-09 MED ORDER — SIMETHICONE 80 MG PO CHEW
80.0000 mg | CHEWABLE_TABLET | Freq: Three times a day (TID) | ORAL | Status: DC
  Administered 2018-11-10 – 2018-11-11 (×4): 80 mg via ORAL
  Filled 2018-11-09 (×5): qty 1

## 2018-11-09 MED ORDER — ZOLPIDEM TARTRATE 5 MG PO TABS: 5.0000 mg | ORAL_TABLET | Freq: Every evening | ORAL | Status: DC | PRN

## 2018-11-09 MED ORDER — NALBUPHINE HCL 10 MG/ML IJ SOLN: 5.0000 mg | Freq: Once | INTRAMUSCULAR | Status: DC | PRN

## 2018-11-09 MED ORDER — NALBUPHINE HCL 10 MG/ML IJ SOLN
5.0000 mg | INTRAMUSCULAR | Status: DC | PRN
  Administered 2018-11-10 (×2): 5 mg via INTRAVENOUS
  Filled 2018-11-09 (×2): qty 1

## 2018-11-09 MED ORDER — FENTANYL CITRATE (PF) 100 MCG/2ML IJ SOLN
25.0000 ug | INTRAMUSCULAR | Status: DC | PRN
  Administered 2018-11-09: 22:00:00 25 ug via INTRAVENOUS

## 2018-11-09 MED ORDER — OXYTOCIN 40 UNITS IN NORMAL SALINE INFUSION - SIMPLE MED: 2.5000 [IU]/h | INTRAVENOUS | Status: AC

## 2018-11-09 MED ORDER — DIBUCAINE (PERIANAL) 1 % EX OINT: 1.0000 "application " | TOPICAL_OINTMENT | CUTANEOUS | Status: DC | PRN

## 2018-11-09 MED ORDER — BUPIVACAINE HCL (PF) 0.25 % IJ SOLN
INTRAMUSCULAR | Status: AC
  Filled 2018-11-09: qty 10

## 2018-11-09 MED ORDER — LIDOCAINE-EPINEPHRINE (PF) 2 %-1:200000 IJ SOLN
INTRAMUSCULAR | Status: AC
  Filled 2018-11-09: qty 20

## 2018-11-09 MED ORDER — LACTATED RINGERS IV SOLN
INTRAVENOUS | Status: DC | PRN
  Administered 2018-11-09 (×2): via INTRAVENOUS

## 2018-11-09 MED ORDER — DEXAMETHASONE SODIUM PHOSPHATE 10 MG/ML IJ SOLN
INTRAMUSCULAR | Status: DC | PRN
  Administered 2018-11-09: 10 mg via INTRAVENOUS

## 2018-11-09 MED ORDER — PROMETHAZINE HCL 25 MG/ML IJ SOLN: 6.2500 mg | INTRAMUSCULAR | Status: DC | PRN

## 2018-11-09 MED ORDER — SODIUM CHLORIDE 0.9 % IV SOLN
INTRAVENOUS | Status: DC | PRN
  Administered 2018-11-09: 20:00:00 40 [IU] via INTRAVENOUS

## 2018-11-09 MED ORDER — SODIUM CHLORIDE 0.9 % IV SOLN
INTRAVENOUS | Status: DC | PRN
  Administered 2018-11-09: 20:00:00 500 mg via INTRAVENOUS

## 2018-11-09 MED ORDER — CEFAZOLIN SODIUM-DEXTROSE 2-3 GM-%(50ML) IV SOLR
INTRAVENOUS | Status: DC | PRN
  Administered 2018-11-09: 2 g via INTRAVENOUS

## 2018-11-09 MED ORDER — MORPHINE SULFATE (PF) 0.5 MG/ML IJ SOLN
INTRAMUSCULAR | Status: AC
  Filled 2018-11-09: qty 10

## 2018-11-09 MED ORDER — MENTHOL 3 MG MT LOZG: 1.0000 | LOZENGE | OROMUCOSAL | Status: DC | PRN

## 2018-11-09 MED ORDER — SODIUM CHLORIDE 0.9% FLUSH: 3.0000 mL | INTRAVENOUS | Status: DC | PRN

## 2018-11-09 MED ORDER — BUPIVACAINE HCL (PF) 0.25 % IJ SOLN
INTRAMUSCULAR | Status: DC | PRN
  Administered 2018-11-09: 30 mL

## 2018-11-09 MED ORDER — NALOXONE HCL 0.4 MG/ML IJ SOLN: 0.4000 mg | INTRAMUSCULAR | Status: DC | PRN

## 2018-11-09 MED ORDER — BUPIVACAINE HCL (PF) 0.25 % IJ SOLN
INTRAMUSCULAR | Status: AC
  Filled 2018-11-09: qty 40

## 2018-11-09 MED ORDER — MEASLES, MUMPS & RUBELLA VAC IJ SOLR: 0.5000 mL | Freq: Once | INTRAMUSCULAR | Status: DC

## 2018-11-09 MED ORDER — MEPERIDINE HCL 25 MG/ML IJ SOLN: 6.2500 mg | INTRAMUSCULAR | Status: DC | PRN

## 2018-11-09 MED ORDER — SODIUM CHLORIDE 0.9 % IV SOLN
2.0000 g | Freq: Four times a day (QID) | INTRAVENOUS | Status: DC
  Administered 2018-11-09: 15:00:00 2 g via INTRAVENOUS
  Filled 2018-11-09 (×2): qty 2000

## 2018-11-09 MED ORDER — PRENATAL MULTIVITAMIN CH
1.0000 | ORAL_TABLET | Freq: Every day | ORAL | Status: DC
  Administered 2018-11-10 – 2018-11-11 (×2): 1 via ORAL
  Filled 2018-11-09 (×2): qty 1

## 2018-11-09 SURGICAL SUPPLY — 34 items
BENZOIN TINCTURE PRP APPL 2/3 (GAUZE/BANDAGES/DRESSINGS) ×3 IMPLANT
CHLORAPREP W/TINT 26ML (MISCELLANEOUS) ×3 IMPLANT
CLAMP CORD UMBIL (MISCELLANEOUS) IMPLANT
CLOSURE STERI-STRIP 1/2X4 (GAUZE/BANDAGES/DRESSINGS) ×1
CLOSURE WOUND 1/2 X4 (GAUZE/BANDAGES/DRESSINGS) ×1
CLOTH BEACON ORANGE TIMEOUT ST (SAFETY) ×3 IMPLANT
CLSR STERI-STRIP ANTIMIC 1/2X4 (GAUZE/BANDAGES/DRESSINGS) ×2 IMPLANT
DRSG OPSITE POSTOP 4X10 (GAUZE/BANDAGES/DRESSINGS) ×3 IMPLANT
ELECT REM PT RETURN 9FT ADLT (ELECTROSURGICAL) ×3
ELECTRODE REM PT RTRN 9FT ADLT (ELECTROSURGICAL) ×1 IMPLANT
EXTRACTOR VACUUM M CUP 4 TUBE (SUCTIONS) IMPLANT
EXTRACTOR VACUUM M CUP 4' TUBE (SUCTIONS)
GLOVE BIOGEL PI IND STRL 7.0 (GLOVE) ×2 IMPLANT
GLOVE BIOGEL PI INDICATOR 7.0 (GLOVE) ×4
GLOVE ECLIPSE 7.0 STRL STRAW (GLOVE) ×6 IMPLANT
GOWN STRL REUS W/TWL LRG LVL3 (GOWN DISPOSABLE) ×6 IMPLANT
KIT ABG SYR 3ML LUER SLIP (SYRINGE) IMPLANT
NEEDLE HYPO 22GX1.5 SAFETY (NEEDLE) ×3 IMPLANT
NEEDLE HYPO 25X5/8 SAFETYGLIDE (NEEDLE) IMPLANT
NS IRRIG 1000ML POUR BTL (IV SOLUTION) ×3 IMPLANT
PACK C SECTION WH (CUSTOM PROCEDURE TRAY) ×3 IMPLANT
PAD ABD 7.5X8 STRL (GAUZE/BANDAGES/DRESSINGS) ×3 IMPLANT
PAD OB MATERNITY 4.3X12.25 (PERSONAL CARE ITEMS) ×3 IMPLANT
PENCIL SMOKE EVAC W/HOLSTER (ELECTROSURGICAL) ×3 IMPLANT
RTRCTR C-SECT PINK 25CM LRG (MISCELLANEOUS) ×3 IMPLANT
SPONGE GAUZE 4X4 12PLY STER LF (GAUZE/BANDAGES/DRESSINGS) ×3 IMPLANT
STRIP CLOSURE SKIN 1/2X4 (GAUZE/BANDAGES/DRESSINGS) ×2 IMPLANT
SUT VIC AB 0 CTX 36 (SUTURE) ×8
SUT VIC AB 0 CTX36XBRD ANBCTRL (SUTURE) ×4 IMPLANT
SUT VIC AB 4-0 KS 27 (SUTURE) ×3 IMPLANT
SYR 30ML LL (SYRINGE) ×3 IMPLANT
TOWEL OR 17X24 6PK STRL BLUE (TOWEL DISPOSABLE) ×3 IMPLANT
TRAY FOLEY W/BAG SLVR 14FR LF (SET/KITS/TRAYS/PACK) ×3 IMPLANT
WATER STERILE IRR 1000ML POUR (IV SOLUTION) ×3 IMPLANT

## 2018-11-09 NOTE — Op Note (Signed)
Cesarean Section Operative Report  PATIENT: Kristy Little  PROCEDURE DATE: 11/09/2018  PREOPERATIVE DIAGNOSES: Intrauterine pregnancy at [redacted]w[redacted]d weeks gestation; failure to progress: arrest of descent  POSTOPERATIVE DIAGNOSES: The same  PROCEDURE: PrimaryLow Transverse Cesarean Section  SURGEON:   Surgeon(s) and Role:    * Donnamae Jude, MD - Primary    * Nicolette Bang, DO - OB Fellow   INDICATIONS: LORAIN FETTES is a 28 y.o. G4P0030 at [redacted]w[redacted]d here for cesarean section secondary to the indications listed under preoperative diagnoses; please see preoperative note for further details.  The risks of cesarean section were discussed with the patient including but were not limited to: bleeding which may require transfusion or reoperation; infection which may require antibiotics; injury to bowel, bladder, ureters or other surrounding organs; injury to the fetus; need for additional procedures including hysterectomy in the event of a life-threatening hemorrhage; placental abnormalities wth subsequent pregnancies, incisional problems, thromboembolic phenomenon and other postoperative/anesthesia complications.   The patient concurred with the proposed plan, giving informed written consent for the procedure.    FINDINGS:  Viable female infant in cephalic presentation, LOT presentation.  Apgars 8 and 9.  Clear amniotic fluid.  Intact placenta, three vessel cord.  Normal uterus, fallopian tubes and ovaries bilaterally.  ANESTHESIA: Epidural INTRAVENOUS FLUIDS: 1200 mL  ESTIMATED BLOOD LOSS:  222 mL  URINE OUTPUT:  300 ml SPECIMENS: Placenta sent to L&D COMPLICATIONS: None immediate  PROCEDURE IN DETAIL:  The patient preoperatively received intravenous antibiotics and had sequential compression devices applied to her lower extremities.  She was then taken to the operating room where the epidural anesthesia was dosed up to surgical level and was found to be adequate. She was then placed  in a dorsal supine position with a leftward tilt, and prepped and draped in a sterile manner.  A foley catheter was placed into her bladder and attached to constant gravity.    After an adequate timeout was performed, a Pfannenstiel skin incision was made with scalpel and carried through to the underlying layer of fascia. The fascia was incised in the midline, and this incision was extended bilaterally using the Mayo scissors.  Kocher clamps were applied to the superior aspect of the fascial incision and the underlying rectus muscles were dissected off bluntly.  A similar process was carried out on the inferior aspect of the fascial incision. The rectus muscles were separated in the midline bluntly and the peritoneum was entered bluntly. Attention was turned to the lower uterine segment where a low transverse hysterotomy was made with a scalpel and extended bilaterally bluntly.  The infant was successfully delivered, the cord was clamped and cut after one minute, and the infant was handed over to the awaiting neonatology team. Uterine massage was then administered, and the placenta delivered intact with a three-vessel cord. The uterus was then cleared of clots and debris.  The hysterotomy was closed with 0 Vicryl in a running locked fashion, and an imbricating layer was also placed with 0 Vicryl.  Figure-of-eight 0 Vicryl serosal stitches were placed to help with hemostasis.  The pelvis was cleared of all clot and debris. Hemostasis was confirmed on all surfaces.  The peritoneum was closed with a 0 Vicryl running stitch. The fascia was then closed using 0 Vicryl in a running fashion.  The subcutaneous layer was irrigated. The skin was closed with a 4-0 Vicryl subcuticular stitch. 30 ml of 0.5% Marcaine was injected subcutaneously around the incision.    The  patient tolerated the procedure well. Sponge, lap, instrument and needle counts were correct x 3.  She was taken to the recovery room in stable condition.    An experienced assistant was required given the standard of surgical care given the complexity of the case.  This assistant was needed for exposure, dissection, suctioning, retraction, instrument exchange, assisting with delivery with administration of fundal pressure, and for overall help during the procedure.   Maternal Disposition: PACU - hemodynamically stable.   Infant Disposition: stable   Marcy Sirenatherine Khaleesi Gruel, D.O. OB Fellow  11/09/2018, 8:24 PM

## 2018-11-09 NOTE — Progress Notes (Signed)
LABOR PROGRESS NOTE  Kristy Little is a 28 y.o. G4P0030 at [redacted]w[redacted]d  admitted for IOL due to NRFHT.  Subjective: She is doing well resting in bed on her left side. She has some anxiety about her induction and procedures but otherwise she is excited to meet her baby. She does not have any discomfort at this time.  Objective: BP (!) 136/113   Pulse 89   Temp 98.4 F (36.9 C) (Oral)   Resp 16   LMP 02/13/2018 (Approximate)  or  Vitals:   11/09/18 0151 11/09/18 0156 11/09/18 0159 11/09/18 0201  BP: 108/66 (!) 101/58 113/87 (!) 136/113  Pulse: 90 96  89  Resp:      Temp:      TempSrc:         Dilation: 4 Effacement (%): 50 Station: -2 Presentation: Vertex Exam by:: Lauren Cox RN  FHT: baseline rate 125 bpm, moderate varibility, 15 x 15 acel, no decel Toco: minimal  Labs: Lab Results  Component Value Date   WBC 10.3 11/08/2018   HGB 12.5 11/08/2018   HCT 37.1 11/08/2018   MCV 94.4 11/08/2018   PLT 181 11/08/2018    Patient Active Problem List   Diagnosis Date Noted  . Fetal heart rate decelerations affecting management of mother 11/08/2018  . Anemia 11/08/2018  . Bipolar affective ([place]) 11/08/2018  . Tobacco abuse 11/08/2018    Assessment / Plan: 28 y.o. G4P0030 at [redacted]w[redacted]d here for IOL due to NRFHT.   Labor: Consider pitocin 6mU/min. Continue time apporpriate cervical exams for the progression of labor. Consider AROM later in the course of induction. Fetal Wellbeing:  Cat I Pain Control:  Epidural in place Anticipated MOD: Vaginal  Kristy Little, D.O. Family Medicine Resident, PGY-1 11/09/2018, 2:33 AM

## 2018-11-09 NOTE — Progress Notes (Signed)
LABOR PROGRESS NOTE  Kristy Little is a 28 y.o. G4P0030 at [redacted]w[redacted]d  admitted for IOL due to NRFHT.  Subjective: Patient pushing.   Objective: BP (!) 98/55   Pulse (!) 101   Temp (!) 100.4 F (38 C) (Axillary)   Resp 20   Ht 5\' 4"  (1.626 m)   Wt 65.6 kg   LMP 02/13/2018 (Approximate)   BMI 24.82 kg/m  or  Vitals:   11/09/18 1302 11/09/18 1331 11/09/18 1401 11/09/18 1502  BP: (!) 133/92 121/78 112/78 (!) 98/55  Pulse:  83 89 (!) 101  Resp:  20 18 20   Temp:  98.2 F (36.8 C)  (!) 100.4 F (38 C)  TempSrc:    Axillary  Weight:      Height:         Dilation: 10 Effacement (%): 90 Cervical Position: Middle Station: Plus 1 Presentation: Vertex Exam by:: sowder  FHT: baseline rate 155 bpm, moderate varibility, +accels, variable decel Toco: q1-2 minutes  Labs: Lab Results  Component Value Date   WBC 10.3 11/08/2018   HGB 12.5 11/08/2018   HCT 37.1 11/08/2018   MCV 94.4 11/08/2018   PLT 181 11/08/2018    Patient Active Problem List   Diagnosis Date Noted  . Fetal heart rate decelerations affecting management of mother 11/08/2018  . Anemia 11/08/2018  . Bipolar affective ([place]) 11/08/2018  . Tobacco abuse 11/08/2018    Assessment / Plan: 28 y.o. G4P0030 at [redacted]w[redacted]d here for IOL due to NRFHT.   Labor: Patient is complete. Has pushed for 1 hour followed by laboring down. Now has been pushing again for 45 minutes. On assessment, fetal presentation appears to be OP, although difficult assessment due to large amount of fetal caput. Labor now complicated by Triple I. Patient felt warm on cervical exam and moments of fetal tachycardia noted. Temperature recheck and now 100.4. Will start Ampicillin and Gentamycin. Will also give Tylenol 1g and IVF bolus.  Fetal Wellbeing:  Cat II but overall still reassuring with moderate variability and +acels  Pain Control:  Epidural in place Anticipated MOD: Vaginal  Corliss Blacker, [facility] 11/09/2018, 3:09 PM

## 2018-11-09 NOTE — Progress Notes (Signed)
Kristy Little is 28 y.o. G70P0030 female at [redacted]w[redacted]d who is here for IOL due to NRFHT. Labor course has been complicated by Triple I, on Ampicillin and Gentamycin. Patient has been complete for >6 hours and has pushed for 4 hours with very little descent of fetal station. Large amount of caput noted on exam and fetal head feels asynclitic. Given this, discussed recommendation for pLTCS due to arrest of descent.   The risks of cesarean section discussed with the patient included but were not limited to: bleeding which may require transfusion or reoperation; infection which may require antibiotics; injury to bowel, bladder, ureters or other surrounding organs; injury to the fetus; need for additional procedures including hysterectomy in the event of a life-threatening hemorrhage; placental abnormalities wth subsequent pregnancies, incisional problems, thromboembolic phenomenon and other postoperative/anesthesia complications. The patient concurred with the proposed plan, giving informed written consent for the procedure.   Patient has been NPO for >8 hours and she will remain NPO for procedure. Anesthesia and OR aware. Preoperative prophylactic antibiotics and SCDs ordered on call to the OR.  To OR when ready.  Consent signed by Dr. Kennon Rounds.  Phill Myron, D.O. [facility] Family Medicine Fellow, [facility] for [facility], [facility] 11/09/2018, 6:24 PM

## 2018-11-09 NOTE — Anesthesia Procedure Notes (Signed)
Epidural Patient location during procedure: OB Start time: 11/09/2018 12:45 AM End time: 11/09/2018 1:00 AM  Staffing Anesthesiologist: Freddrick March, MD Performed: anesthesiologist   Preanesthetic Checklist Completed: patient identified, pre-op evaluation, timeout performed, IV checked, risks and benefits discussed and monitors and equipment checked  Epidural Patient position: sitting Prep: site prepped and draped and DuraPrep Patient monitoring: continuous pulse ox, blood pressure, heart rate and cardiac monitor Approach: midline Location: L3-L4 Injection technique: LOR air  Needle:  Needle type: Tuohy  Needle gauge: 17 G Needle length: 9 cm Needle insertion depth: 5 cm Catheter type: closed end flexible Catheter size: 19 Gauge Catheter at skin depth: 10 cm Test dose: negative  Assessment Sensory level: T8 Events: blood not aspirated, injection not painful, no injection resistance, negative IV test and no paresthesia  Additional Notes Patient identified. Risks/Benefits/Options discussed with patient including but not limited to bleeding, infection, nerve damage, paralysis, failed block, incomplete pain control, headache, blood pressure changes, nausea, vomiting, reactions to medication both or allergic, itching and postpartum back pain. Confirmed with bedside nurse the patient's most recent platelet count. Confirmed with patient that they are not currently taking any anticoagulation, have any bleeding history or any family history of bleeding disorders. Patient expressed understanding and wished to proceed. All questions were answered. Sterile technique was used throughout the entire procedure. Please see nursing notes for vital signs. Test dose was given through epidural catheter and negative prior to continuing to dose epidural or start infusion. Warning signs of high block given to the patient including shortness of breath, tingling/numbness in hands, complete motor block,  or any concerning symptoms with instructions to call for help. Patient was given instructions on fall risk and not to get out of bed. All questions and concerns addressed with instructions to call with any issues or inadequate analgesia.  Reason for block:procedure for pain

## 2018-11-09 NOTE — H&P (Signed)
Incomplete         Added by: [x] Autry-Lott, Simone, DO      I took over this H&P to complete it [] Hover for details LABOR AND DELIVERY ADMISSION HISTORY AND PHYSICAL NOTE  Kristy ItoKeaunna D Little is a 28 y.o. female 404P0030 with IUP at 2064w3d by US presenting for NRFHT.  She reports positive fetal movement. She denies leakage of fluid or vaginal bleeding.  She was seen last night for a labor eval and had a reassuring tracing with one prolonged Decel.  We monitored over another hour or so with reassuring FHR pattern.  Tonight she had intermittent variable decels so decision was made to admit and augment her labor.   P[facility]/Complications[facility] Pregnancy complications:  - Patient Active Problem List   Diagnosis Date Noted  . Fetal heart rate decelerations affecting management of mother 11/08/2018  . Anemia 11/08/2018  . Bipolar affective (HCC) 11/08/2018  . Tobacco abuse 11/08/2018    Past Medical History:     Past Medical History:  Diagnosis Date  . Chlamydia   . Infection    UTI  . Migraine     Past Surgical History:      Past Surgical History:  Procedure Laterality Date  . INDUCED ABORTION      Obstetrical History:         OB History    Gravida  4   Para      Term      Preterm      AB  3   Living  0     SAB  3   TAB  0   Ectopic      Multiple      Live Births              Social History: Social History        Socioeconomic History  . Marital status: Single    Spouse name: Not on file  . Number of children: Not on file  . Years of education: Not on file  . Highest education level: Not on file  Occupational History  . Not on file  Social Needs  . Financial resource strain: Not on file  . Food insecurity    Worry: Not on file    Inability: Not on file  . Transportation needs    Medical: Not on file    Non-medical: Not on file  Tobacco Use  . Smoking status: Former Smoker    Types:  Cigarettes, Cigars  . Smokeless tobacco: Never Used  . Tobacco comment: 2016  Substance and Sexual Activity  . Alcohol use: No  . Drug use: No  . Sexual activity: Yes    Birth control/protection: None  Lifestyle  . Physical activity    Days per week: Not on file    Minutes per session: Not on file  . Stress: Not on file  Relationships  . Social Musicianconnections    Talks on phone: Not on file    Gets together: Not on file    Attends religious service: Not on file    Active member of club or organization: Not on file    Attends meetings of clubs or organizations: Not on file    Relationship status: Not on file  Other Topics Concern  . Not on file  Social History Narrative  . Not on file    Family History:      Family History  Problem Relation Age of Onset  .  Stroke Mother   . Hypertension Mother   . Diabetes Paternal Aunt     Allergies:      Allergies  Allergen Reactions  . Latex Other (See Comments)    Irritation            Medications Prior to Admission  Medication Sig Dispense Refill Last Dose  . Prenatal Vit-Fe Fumarate-FA (MULTIVITAMIN-PRENATAL) 27-0.8 MG TABS tablet Take 1 tablet by mouth daily. 30 each 10 11/07/2018 at Unknown time  . Acetaminophen (TYLENOL PO) Take by mouth.   Unknown at Unknown time  . cyclobenzaprine (FLEXERIL) 10 MG tablet Take 1 tablet (10 mg total) by mouth 3 (three) times daily as needed for muscle spasms. 30 tablet 0 Unknown at Unknown time  . Hyoscyamine Sulfate SL (LEVSIN/SL) 0.125 MG SUBL Place 1 tablet under the tongue every 8 (eight) hours as needed (cramping). 15 tablet 0 Unknown at Unknown time  . metroNIDAZOLE (FLAGYL) 500 MG tablet Take 1 tablet (500 mg total) by mouth 2 (two) times daily. 14 tablet 0 Unknown at Unknown time  . simethicone (MYLICON) 80 MG chewable tablet Chew 1 tablet (80 mg total) by mouth 4 (four) times daily as needed for flatulence. 30 tablet 0 Unknown at Unknown time     Review  of Systems  All systems reviewed and negative except as stated in HPI  Physical Exam Pulse 73, temperature 98.4 F (36.9 C), temperature source Oral, resp. rate 18, last menstrual period 02/13/2018, unknown if currently breastfeeding. General appearance: alert, oriented Lungs: normal respiratory effort Heart: regular rate Abdomen: soft, non-tender; gravid, FH appropriate for GA Extremities: No calf swelling or tenderness Presentation: cephalic Fetal monitoring: FHR 135 with average variability and + accels with intermittent variable decels Uterine activity: Irregular, every 2-4 min Dilation: 2 Effacement (%): 80 Station: -2 Exam by:: Kristy MuskratLauren Cox RN   Previous exam was 1cm  Prenatal labs: ABO, Rh: --/--/PENDING (07/30 2327) Antibody: PENDING (07/30 2327) Rubella:  Immune RPR:  None reactive  HBsAg:  Negative  HIV:  Non reactive x 2 GC/Chlamydia: Negative GBS:  Negative  2-hr GTT: WNL Genetic screening: WNL Anatomy US: Bilateraly renal pelvis dilation noted and RVOT not well visualized  Prenatal Transfer Tool  Maternal Diabetes: No Genetic Screening: Normal Maternal Ultrasounds/Referrals: Fetal Kidney Anomalies: mild dilation of right and left renal pelvis Fetal Ultrasounds or other Referrals:  Other: Follow up suggested after Anatomy scan due to renal pelvis dilation and RVOT not well visualized Maternal Substance Abuse:  No Significant Maternal Medications:  None Significant Maternal Lab Results: Group B Strep negative        Results for orders placed or performed during the hospital encounter of 11/08/18 (from the past 24 hour(s))  CBC   Collection Time: 11/08/18 11:27 PM  Result Value Ref Range   WBC 10.3 4.0 - 10.5 K/uL   RBC 3.93 3.87 - 5.11 MIL/uL   Hemoglobin 12.5 12.0 - 15.0 g/dL   HCT 16.137.1 09.636.0 - 04.546.0 %   MCV 94.4 80.0 - 100.0 fL   MCH 31.8 26.0 - 34.0 pg   MCHC 33.7 30.0 - 36.0 g/dL   RDW 40.912.7 81.111.5 - 91.415.5 %   Platelets 181 150 - 400 K/uL    nRBC 0.0 0.0 - 0.2 %  Type and screen [facility]   Collection Time: 11/08/18 11:27 PM  Result Value Ref Range   ABO/RH(D) PENDING    Antibody Screen PENDING    Sample Expiration      11/11/2018,2359  Performed at [facility], [place] [address], [place], [place] [address]         Patient Active Problem List   Diagnosis Date Noted  . Fetal heart rate decelerations affecting management of mother 11/08/2018  . Anemia 11/08/2018  . Bipolar affective ([place]) 11/08/2018  . Tobacco abuse 11/08/2018    Assessment: Kristy Little is a 28 y.o. G4P0030 at [redacted]w[redacted]d here for IOL due to NRFHT.  #Labor: Conisder cytotec and FB. Time apporpriate cervical exams for progression of labor. Consider pitocin later in the course of labor. #Pain:  Patient may have epidural #FWB: Cat I, vertex, EFW 316g @20wks  78%. #ID: GBS negative #MOF: Breast #MOC:undecided #Circ:   undecided  Gerlene Fee, DO Family Medicine Resident, PGY-1 11/09/2018, 12:04 AM       .Completed by me Agree with Resident's note  Seabron Spates, CNM

## 2018-11-09 NOTE — Progress Notes (Addendum)
LABOR PROGRESS NOTE  Kristy Little is a 28 y.o. G4P0030 at [redacted]w[redacted]d  admitted for IOL due to NRFHT.  Subjective: Doing well without complaints.  Objective: BP 120/81   Pulse (!) 110   Temp (P) 97.9 F (36.6 C) (Oral)   Resp (P) 18   Ht 5\' 4"  (1.626 m)   Wt 65.6 kg   LMP 02/13/2018 (Approximate)   BMI 24.82 kg/m  or  Vitals:   11/09/18 0731 11/09/18 0801 11/09/18 0830 11/09/18 0831  BP: 103/72 103/68  120/81  Pulse: 79 84  (!) 110  Resp:   (P) 18   Temp:   (P) 97.9 F (36.6 C)   TempSrc:   (P) Oral   Weight:      Height:         Dilation: (P) 8 Effacement (%): (P) 90 Cervical Position: (P) Middle Station: (P) 0, Plus 1 Presentation: (P) Vertex Exam by:: (P) Zarayah Lanting FHT: baseline rate 120 bpm, moderate varibility, +accels, no decel Toco: q1-2 minutes  Labs: Lab Results  Component Value Date   WBC 10.3 11/08/2018   HGB 12.5 11/08/2018   HCT 37.1 11/08/2018   MCV 94.4 11/08/2018   PLT 181 11/08/2018    Patient Active Problem List   Diagnosis Date Noted  . Fetal heart rate decelerations affecting management of mother 11/08/2018  . Anemia 11/08/2018  . Bipolar affective ([place]) 11/08/2018  . Tobacco abuse 11/08/2018    Assessment / Plan: 28 y.o. G4P0030 at [redacted]w[redacted]d here for IOL due to NRFHT.   Labor: progressing well, pitocin @6 . AROM performed @0845  with clear fluid. Fetal Wellbeing:  Cat I Pain Control:  Epidural in place Anticipated MOD: Vaginal  Corliss Blacker, [facility] 11/09/2018, 8:46 AM

## 2018-11-09 NOTE — Progress Notes (Signed)
ANTIBIOTIC CONSULT NOTE - INITIAL  Pharmacy Consult for Gentamicin Indication: Chorioamnionitis   Allergies  Allergen Reactions  . Latex Other (See Comments)    Irritation     Patient Measurements: Height: 5\' 4"  (162.6 cm) Weight: 144 lb 10 oz (65.6 kg) IBW/kg (Calculated) : 54.7 Adjusted Body Weight: 59 kg  Vital Signs: Temp: 100.4 F (38 C) (07/31 1502) Temp Source: Axillary (07/31 1502) BP: 98/55 (07/31 1502) Pulse Rate: 101 (07/31 1502)  Labs: Recent Labs    11/08/18 2327  WBC 10.3  HGB 12.5  PLT 181   No results for input(s): GENTTROUGH, GENTPEAK, GENTRANDOM in the last 72 hours.   Microbiology: Recent Results (from the past 720 hour(s))  OB RESULT CONSOLE Group B Strep     Status: None   Collection Time: 10/25/18 12:00 AM  Result Value Ref Range Status   GBS Negative  Final  SARS Coronavirus 2 (CEPHEID - Performed in [facility] lab), Hosp Order     Status: None   Collection Time: 11/08/18 11:35 PM   Specimen: Nasopharyngeal Swab  Result Value Ref Range Status   SARS Coronavirus 2 NEGATIVE NEGATIVE Final    Comment: (NOTE) If result is NEGATIVE SARS-CoV-2 target nucleic acids are NOT DETECTED. The SARS-CoV-2 RNA is generally detectable in upper and lower  respiratory specimens during the acute phase of infection. The lowest  concentration of SARS-CoV-2 viral copies this assay can detect is 250  copies / mL. A negative result does not preclude SARS-CoV-2 infection  and should not be used as the sole basis for treatment or other  patient management decisions.  A negative result may occur with  improper specimen collection / handling, submission of specimen other  than nasopharyngeal swab, presence of viral mutation(s) within the  areas targeted by this assay, and inadequate number of viral copies  (<250 copies / mL). A negative result must be combined with clinical  observations, patient history, and epidemiological information. If result is  POSITIVE SARS-CoV-2 target nucleic acids are DETECTED. The SARS-CoV-2 RNA is generally detectable in upper and lower  respiratory specimens dur ing the acute phase of infection.  Positive  results are indicative of active infection with SARS-CoV-2.  Clinical  correlation with patient history and other diagnostic information is  necessary to determine patient infection status.  Positive results do  not rule out bacterial infection or co-infection with other viruses. If result is PRESUMPTIVE POSTIVE SARS-CoV-2 nucleic acids MAY BE PRESENT.   A presumptive positive result was obtained on the submitted specimen  and confirmed on repeat testing.  While 2019 novel coronavirus  (SARS-CoV-2) nucleic acids may be present in the submitted sample  additional confirmatory testing may be necessary for epidemiological  and / or clinical management purposes  to differentiate between  SARS-CoV-2 and other Sarbecovirus currently known to infect humans.  If clinically indicated additional testing with an alternate test  methodology [phone]([id number]) is advised. The SARS-CoV-2 RNA is generally  detectable in upper and lower respiratory sp ecimens during the acute  phase of infection. The expected result is Negative. Fact Sheet for Patients:  BoilerBrush.com.cyhttps://www.fda.gov/media/136312/download Fact Sheet for Healthcare Providers: https://pope.com/https://www.fda.gov/media/136313/download This test is not yet approved or cleared by the [place] FDA and has been authorized for detection and/or diagnosis of SARS-CoV-2 by FDA under an Emergency Use Authorization (EUA).  This EUA will remain in effect (meaning this test can be used) for the duration of the COVID-19 declaration under Section 564(b)(1) of the Act, 21 U.S.C.  section 360bbb-3(b)(1), unless the authorization is terminated or revoked sooner. Performed at [facility], [place] [address], [place], [place] [address]     Medications:  Ampicillin 2gm IV q 6h  Goal of  Therapy:  Gentamicin peak 20-25 mg/L and Trough < 1 mg/L  Plan:  Gentamicin 330 mg (5mg /kg) IV every 24 hrs  Check Scr with next labs if gentamicin continued. Will check gentamicin levels if continued > 72hr or clinically indicated.  Wyline Mood 11/09/2018,3:20 PM

## 2018-11-09 NOTE — Consult Note (Signed)
Neonatology Note:   Attendance at C-section:    I was asked by Dr. Wallace to attend this C/S at term due to failure to descend. The mother is a G4P0030, GBS negative with good prenatal care complicated by chorio for which on Amp/Gent >4hrs and bipolar, tobacco use. ROM 11 hours before delivery, fluid clear. Infant vigorous with good spontaneous cry and tone. +60 sec DCC.   Needed only minimal bulb suctioning. Ap 8/9. Lungs clear to ausc in DR. Well appearing.  Mother updated.  To CN to care of Pediatrician for routine care.    David C. Ehrmann, MD    

## 2018-11-09 NOTE — Anesthesia Postprocedure Evaluation (Signed)
Anesthesia Post Note  Patient: Kristy Little  Procedure(s) Performed: CESAREAN SECTION (N/A )     Patient location during evaluation: PACU Anesthesia Type: Epidural Level of consciousness: awake and alert Pain management: pain level controlled Vital Signs Assessment: post-procedure vital signs reviewed and stable Respiratory status: spontaneous breathing, respiratory function stable and nonlabored ventilation Cardiovascular status: blood pressure returned to baseline Postop Assessment: epidural receding and no apparent nausea or vomiting Anesthetic complications: no    Last Vitals:  Vitals:   11/09/18 2100 11/09/18 2115  BP: 115/84 (P) 125/89  Pulse: 82   Resp: 20   Temp: 37 C   SpO2: 100%     Last Pain:  Vitals:   11/09/18 2145  TempSrc:   PainSc: (P) 3    Pain Goal: Patients Stated Pain Goal: 0 (11/08/18 2103)  LLE Motor Response: (P) Purposeful movement (11/09/18 2145) LLE Sensation: (P) Tingling (11/09/18 2145) RLE Motor Response: (P) Purposeful movement (11/09/18 2145) RLE Sensation: (P) Tingling (11/09/18 2145)        Audry Pili

## 2018-11-09 NOTE — Discharge Summary (Signed)
OB Discharge Summary     Patient Name: Kristy Little DOB: 1990/10/21 MRN: [id number]  Date of admission: 11/08/2018 Delivering MD: Arvilla MarketWALLACE, CATHERINE LAUREN   Date of discharge: 11/11/2018  Admitting diagnosis: CTX  Intrauterine pregnancy: 5250w3d     Secondary diagnosis:  Active Problems:   Fetal heart rate decelerations affecting management of mother   Anemia   Bipolar affective (HCC)   Tobacco abuse   Status post primary low transverse cesarean section  Additional problems: none     Discharge diagnosis: Term Pregnancy Delivered                                                                                                Post partum procedures:none  Augmentation: AROM, Pitocin, Cytotec and Foley Balloon  Complications: Intrauterine Inflammation or infection (Chorioamniotis)  Hospital course:  Induction of Labor With Cesarean Section  28 y.o. yo G4P0030 at 2950w3d was admitted to the hospital 11/08/2018 for induction of labor due to NRNST. Patient had a labor course significant for Triple I. The patient went for cesarean section due to Arrest of Descent, and delivered a Viable infant,11/09/2018  Membrane Rupture Time/Date: 8:44 AM ,11/09/2018   Details of operation can be found in separate operative Note.  Patient had an uncomplicated postpartum course. She is ambulating, tolerating a regular diet, passing flatus, and urinating well.  Patient is discharged home in stable condition on 11/11/18.                                    Physical exam  Vitals:   11/10/18 0231 11/10/18 0600 11/10/18 2117 11/11/18 0641  BP:  109/80 98/68 101/72  Pulse:  65 63 65  Resp: 18 18 16 18   Temp: 98 F (36.7 C) 98.4 F (36.9 C) 97.7 F (36.5 C) 98 F (36.7 C)  TempSrc: Oral Oral Oral Oral  SpO2: 98% 96%  97%  Weight:      Height:       General: alert, cooperative and no distress Lochia: appropriate Uterine Fundus: firm Incision: Dressing is clean, dry, and intact DVT Evaluation: No  evidence of DVT seen on physical exam. Negative Homan's sign. No cords or calf tenderness. No significant calf/ankle edema. Labs: Lab Results  Component Value Date   WBC 32.6 (H) 11/10/2018   HGB 12.6 11/10/2018   HCT 37.0 11/10/2018   MCV 93.0 11/10/2018   PLT 199 11/10/2018   CMP Latest Ref Rng & Units 11/10/2018  Glucose 70 - 99 mg/dL -  BUN 6 - 23 mg/dL -  Creatinine 4.090.44 - 8.111.00 mg/dL 9.140.82  Sodium 782135 - 956145 mmol/L -  Potassium 3.5 - 5.1 mmol/L -  Chloride 96 - 112 mmol/L -  CO2 19 - 32 mmol/L -  Calcium 8.4 - 10.5 mg/dL -  Total Protein 6.0 - 8.3 g/dL -  Total Bilirubin 0.3 - 1.2 mg/dL -  Alkaline Phos 39 - 213117 U/L -  AST 0 - 37 U/L -  ALT 0 - 35 U/L -  Discharge instruction: per After Visit Summary and "Baby and Me Booklet".  After visit meds:  Allergies as of 11/11/2018      Reactions   Latex Other (See Comments)   Irritation       Medication List    STOP taking these medications   cyclobenzaprine 10 MG tablet Commonly known as: FLEXERIL   metroNIDAZOLE 500 MG tablet Commonly known as: Flagyl   TYLENOL PO     TAKE these medications   ibuprofen 800 MG tablet Commonly known as: ADVIL Take 1 tablet (800 mg total) by mouth every 6 (six) hours.   multivitamin-prenatal 27-0.8 MG Tabs tablet Take 1 tablet by mouth daily.   oxyCODONE-acetaminophen 5-325 MG tablet Commonly known as: Percocet Take 1-2 tablets by mouth every 6 (six) hours as needed for severe pain.   simethicone 80 MG chewable tablet Commonly known as: MYLICON Chew 1 tablet (80 mg total) by mouth 4 (four) times daily as needed for flatulence.       Diet: routine diet  Activity: Advance as tolerated. Pelvic rest for 6 weeks.   Outpatient follow up:4 weeks Follow up Appt:No future appointments. Follow up Visit:No follow-ups on file.  Postpartum contraception: Undecided  Newborn Data: Live born female  Birth Weight:  2940 g, 6lb 7.7 oz  APGAR: 6, 9  Newborn Delivery   Birth  date/time: 11/09/2018 19:42:00 Delivery type: C-Section, Low Transverse Trial of labor: Yes C-section categorization: Primary      Baby Feeding: Bottle and Breast Disposition:home with mother   11/11/2018 Fatima Blank, CNM

## 2018-11-09 NOTE — Anesthesia Preprocedure Evaluation (Signed)
Anesthesia Evaluation  Patient identified by MRN, date of birth, ID band Patient awake    Reviewed: Allergy & Precautions, NPO status , Patient's Chart, lab work & pertinent test results  Airway Mallampati: II  TM Distance: >3 FB Neck ROM: Full    Dental no notable dental hx.    Pulmonary neg pulmonary ROS, former smoker,    Pulmonary exam normal breath sounds clear to auscultation       Cardiovascular negative cardio ROS Normal cardiovascular exam Rhythm:Regular Rate:Normal     Neuro/Psych  Headaches, PSYCHIATRIC DISORDERS Bipolar Disorder    GI/Hepatic negative GI ROS, Neg liver ROS,   Endo/Other  negative endocrine ROS  Renal/GU negative Renal ROS  negative genitourinary   Musculoskeletal negative musculoskeletal ROS (+)   Abdominal   Peds  Hematology negative hematology ROS (+)   Anesthesia Other Findings   Reproductive/Obstetrics (+) Pregnancy                             Anesthesia Physical Anesthesia Plan  ASA: II  Anesthesia Plan: Epidural   Post-op Pain Management:    Induction:   PONV Risk Score and Plan: Treatment may vary due to age or medical condition  Airway Management Planned: Natural Airway  Additional Equipment:   Intra-op Plan:   Post-operative Plan:   Informed Consent: I have reviewed the patients History and Physical, chart, labs and discussed the procedure including the risks, benefits and alternatives for the proposed anesthesia with the patient or authorized representative who has indicated his/her understanding and acceptance.       Plan Discussed with: Anesthesiologist  Anesthesia Plan Comments: (Patient identified. Risks, benefits, options discussed with patient including but not limited to bleeding, infection, nerve damage, paralysis, failed block, incomplete pain control, headache, blood pressure changes, nausea, vomiting, reactions to  medication, itching, and post partum back pain. Confirmed with bedside nurse the patient's most recent platelet count. Confirmed with the patient that they are not taking any anticoagulation, have any bleeding history or any family history of bleeding disorders. Patient expressed understanding and wishes to proceed. All questions were answered. )        Anesthesia Quick Evaluation

## 2018-11-09 NOTE — Transfer of Care (Signed)
Immediate Anesthesia Transfer of Care Note  Patient: Kristy Little  Procedure(s) Performed: CESAREAN SECTION (N/A )  Patient Location: PACU  Anesthesia Type:Epidural  Level of Consciousness: awake  Airway & Oxygen Therapy: Patient Spontanous Breathing  Post-op Assessment: Report given to RN and Post -op Vital signs reviewed and stable  Post vital signs: stable  Last Vitals:  Vitals Value Taken Time  BP 104/66 11/09/18 2025  Temp    Pulse 100 11/09/18 2028  Resp 15 11/09/18 2028  SpO2 94 % 11/09/18 2028  Vitals shown include unvalidated device data.  Last Pain:  Vitals:   11/09/18 1843  TempSrc: Oral  PainSc:       Patients Stated Pain Goal: 0 (36/62/94 7654)  Complications: No apparent anesthesia complications

## 2018-11-09 NOTE — Progress Notes (Signed)
Patient ID: Kristy Little, female   DOB: September 15, 1990, 28 y.o.   MRN: [id number] Doing well  Vitals:   11/09/18 0156 11/09/18 0159 11/09/18 0201 11/09/18 0231  BP: (!) 101/58 113/87 (!) 136/113 (!) 141/110  Pulse: 96  89 (!) 175  Resp:      Temp:      TempSrc:       FHR reassuring, occasional variable decels UCs spaced to q5-6 min  Will start some Pitocin for augmentation  Dilation: 4 Effacement (%): 50 Station: -2 Presentation: Vertex Exam by:: s. autry-lott, do

## 2018-11-10 ENCOUNTER — Encounter (HOSPITAL_COMMUNITY): Payer: Self-pay | Admitting: Family Medicine

## 2018-11-10 LAB — CBC
HCT: 37 % (ref 36.0–46.0)
Hemoglobin: 12.6 g/dL (ref 12.0–15.0)
MCH: 31.7 pg (ref 26.0–34.0)
MCHC: 34.1 g/dL (ref 30.0–36.0)
MCV: 93 fL (ref 80.0–100.0)
Platelets: 199 10*3/uL (ref 150–400)
RBC: 3.98 MIL/uL (ref 3.87–5.11)
RDW: 12.8 % (ref 11.5–15.5)
WBC: 32.6 10*3/uL — ABNORMAL HIGH (ref 4.0–10.5)
nRBC: 0 % (ref 0.0–0.2)

## 2018-11-10 LAB — CREATININE, SERUM
Creatinine, Ser: 0.82 mg/dL (ref 0.44–1.00)
GFR calc Af Amer: >60 mL/min (ref >60)
GFR calc non Af Amer: >60 mL/min (ref >60)

## 2018-11-10 MED ORDER — HYDROXYZINE HCL 25 MG PO TABS
25.0000 mg | ORAL_TABLET | Freq: Four times a day (QID) | ORAL | Status: DC | PRN
  Administered 2018-11-10: 25 mg via ORAL
  Filled 2018-11-10: qty 1

## 2018-11-10 NOTE — Progress Notes (Addendum)
POSTPARTUM PROGRESS NOTE  POD #1  Subjective:  Kristy Little is a 28 y.o. G4P0030 s/p pLTCS at [redacted]w[redacted]d.  She reports she doing well. No acute events overnight. She denies any problems with ambulating, voiding or po intake. Denies nausea or vomiting. She has passed flatus. Pain is well controlled.  Lochia is appropriate. Patient removed partial pressure dressing overnight and would like her foley catheter out at this time.  Objective: Blood pressure 109/80, pulse 65, temperature 98.4 F (36.9 C), temperature source Oral, resp. rate 18, height 5\' 4"  (1.626 m), weight 65.6 kg, last menstrual period 02/13/2018, SpO2 96 %, unknown if currently breastfeeding.  Physical Exam:  General: alert, cooperative and no distress Chest: no respiratory distress Heart: distal pulses intact Abdomen: soft, nontender,  Uterine Fundus: firm, appropriately tender DVT Evaluation: No calf swelling or tenderness Extremities: No edema Skin: warm, dry; incision clean/intact w/ honeycomb dressing in place. Considerable bloody drainage at the site.  Recent Labs    11/08/18 2327 11/10/18 0347  HGB 12.5 12.6  HCT 37.1 37.0    Assessment/Plan: Kristy Little is a 28 y.o. G4P0030 s/p pLTCS at [redacted]w[redacted]d for failure to descent.  POD#1 - Doing welll; pain well controlled. H/H appropriate  Routine postpartum care  OOB, ambulated  Lovenox for VTE prophylaxis Anemia: N /A Contraception: unsure Feeding: Breast  Dispo: Plan for discharge home 11/12/18.   LOS: 2 days   Gerlene Fee, D.O. Family Medicine Resident, PGY-1  11/10/2018, 6:49 AM

## 2018-11-10 NOTE — Lactation Note (Signed)
This note was copied from a baby's chart. Lactation Consultation Note  Patient Name: Kristy Little [id number] Date: 11/10/2018 Reason for consult: Initial assessment;Primapara;1st time breastfeeding;Early term 37-38.6wks  LC in to visit with P23 Mom of ET infant at 73 hrs old.  Mom choosing to breast and formula feed baby.  Baby has breastfed 5 times and had 3 bottles 5-15 ml each.    Encouraged Mom to keep baby STS and offer the breast prior to the formula.  Reviewed small stomach size and how colostrum is the perfect food for baby right now.  Mom states her nipples are sore.  Offered to assist with positioning and latch.  Nipple skin is intact, no blistering or cracks noted.  Baby unwrapped and placed skin to skin on Mom.  He immediately started cueing.  Mom positioned upright in bed, and baby place in football hold on right breast.  Reviewed breast massage and hand expression, colostrum drop noted.  Baby opens, but not very wide.  Explained how latching to breast deeply will help baby obtain more milk and her nipples won't be traumatized.  Baby able to latch deeply after waiting for a wider gape of mouth.    Encouraged STS and feeding baby often with cues. Lactation brochure left in room.  Mom informed of IP and OP lactation support available to her.  Encouraged her to ask for help prn.  Maternal Data Formula Feeding for Exclusion: Yes Reason for exclusion: Mother's choice to formula and breast feed on admission Has patient been taught Hand Expression?: Yes Does the patient have breastfeeding experience prior to this delivery?: No  Feeding Feeding Type: Breast Fed Nipple Type: Slow - flow  LATCH Score Latch: Grasps breast easily, tongue down, lips flanged, rhythmical sucking.  Audible Swallowing: A few with stimulation  Type of Nipple: Everted at rest and after stimulation  Comfort (Breast/Nipple): Soft / non-tender  Hold (Positioning): Assistance needed to correctly position  infant at breast and maintain latch.  LATCH Score: 8  Interventions Interventions: Breast feeding basics reviewed;Assisted with latch;Skin to skin;Breast massage;Hand express;Breast compression;Adjust position;Support pillows;Position options   Consult Status Consult Status: Follow-up Date: 11/11/18 Follow-up type: In-patient    Broadus John 11/10/2018, 12:45 PM

## 2018-11-10 NOTE — Plan of Care (Signed)
Patient had c-section dressing changed this morning on this shift. It is currently again saturated and lifting.  Patient accidentally removed her IV. Site WNL. Also has not voided since foley removal at 0900, feels the urge. Will give her more time to increase intake and attempt to void.  Updated Dr. Juleen China on patient status, verbal order to change dressing.Order currently already in for dressing change.

## 2018-11-10 NOTE — Progress Notes (Signed)
RN rounded on patient and saw patient sitting at the bedside.  Patient said her legs were burning and she had to sit up.  RN reminded the patient to call for assistance if need to sit up and do not get up by herself. Since patient was already at the bedside we checked her orthostatics BP.  Patient reported that she had already walk around the room by herself.  RN encouraged patient to call for assistance, reminded patient the foley catheter and IV tubing are still connected to patient and her safety is our priority.  Patient verbalized understand.

## 2018-11-10 NOTE — Progress Notes (Signed)
CLINICAL SOCIAL WORK MATERNAL/CHILD NOTE  Patient Details  Name: Kristy Little MRN: [id number] Date of Birth: 11/09/2018  Date:  11/10/2018  Clinical Social Worker Initiating Note:  Durward Fortes, LCSW Date/Time: Initiated:  11/10/18/1115     Child's Name:  Kristy Little   Biological Parents:  Mother, Father   Need for Interpreter:  None   Reason for Referral:  Behavioral Health Concerns   Address:  [place] [address]    Phone number:  [phone] (home)     Additional phone number: none   Household Members/Support Persons (HM/SP):   Household Member/Support Person 1   HM/SP Name Relationship DOB or Age  HM/SP -1 Maeby Prezioso (MOB)  MOB   1991-04-10  HM/SP -2        HM/SP -3        HM/SP -4        HM/SP -5        HM/SP -6        HM/SP -7        HM/SP -8          Natural Supports (not living in the home):  Immediate Family   Professional Supports: None   Employment: Unemployed   Type of Work: none   Education:  Other (comment)(Obtained GED.)   Homebound arranged:  n/a  Museum/gallery curator Resources:  Medicaid   Other Resources:  Physicist, medical , [place]   Cultural/Religious Considerations Which May Impact Care:  none reported.   Strengths:  Ability to meet basic needs , Compliance with medical plan , Home prepared for child    Psychotropic Medications:    none      Pediatrician:     not chosen yet.   Pediatrician List:   [facility]      Pediatrician Fax Number:    Risk Factors/Current Problems:  None   Cognitive State:  Alert , Able to Concentrate    Mood/Affect:  Calm , Comfortable , Relaxed    CSW Assessment: CSW consulted as MOB has history of Bipolar. CSW spoke with MOB at bedside regarding further needs.   CSW congratulated MOB on the birth of infant. CSW advised MOB of the HIPPA policy as Mob had guest in the room. MOB  reported that this was her mom and that it was okay for her to remain in the room while CSW spoke with her.   CSW advised MOB of the reason for the visit. MOB reported that she was diagnosed with Bipolar at the age of 10 but this was a time when her father had just passed away. CSW understanding of his. MOB reports that she has been doing fine since then with no concerns. MOB reports that she was given medication but never took it. MOB reports that she was also place din therapy but is no longer in that and doesn't need it. MOB reports that she is excited about infant.   CSW provided MOB with PPD and SIDS education. MOB Was given PPD Checklist to better monitor feelings as presented. MOB reports that she has all the needed items to care for infant with no further concerns at this time.   CSW Plan/Description:  No Further Intervention Required/No Barriers to Discharge, Sudden Infant Death Syndrome (SIDS) Education, Perinatal Mood and Anxiety Disorder (PMADs) Education    Wetzel Bjornstad,  LCSWA 11/10/2018, 11:26 AM

## 2018-11-11 MED ORDER — OXYCODONE-ACETAMINOPHEN 5-325 MG PO TABS
1.0000 | ORAL_TABLET | Freq: Four times a day (QID) | ORAL | Status: DC | PRN
  Administered 2018-11-11 (×6): 1 via ORAL
  Filled 2018-11-11: qty 1
  Filled 2018-11-11: qty 2
  Filled 2018-11-11 (×4): qty 1

## 2018-11-11 MED ORDER — IBUPROFEN 800 MG PO TABS: 800.0000 mg | ORAL_TABLET | Freq: Four times a day (QID) | ORAL | 0 refills | Status: DC

## 2018-11-11 MED ORDER — OXYCODONE-ACETAMINOPHEN 5-325 MG PO TABS: 1.0000 | ORAL_TABLET | Freq: Four times a day (QID) | ORAL | 0 refills | Status: AC | PRN

## 2018-11-11 NOTE — Lactation Note (Signed)
This note was copied from a baby's chart. Lactation Consultation Note  Patient Name: Kristy Little [id number] Date: 11/11/2018 Reason for consult: Follow-up assessment;Early term 27-38.6wks Baby is 37 hours old/2% weight loss.  Mom is both breastfeeding and supplementing with formula.  She reports that baby is latching easily and she has not concerns.  Discussed milk coming to volume and the prevention and treatment of engorgement.  Reviewed lactation outpatient services and encouraged to call prn.  Maternal Data    Feeding Feeding Type: Breast Fed  LATCH Score Latch: Grasps breast easily, tongue down, lips flanged, rhythmical sucking.  Audible Swallowing: A few with stimulation  Type of Nipple: Everted at rest and after stimulation  Comfort (Breast/Nipple): Soft / non-tender  Hold (Positioning): No assistance needed to correctly position infant at breast.  LATCH Score: 9  Interventions    Lactation Tools Discussed/Used     Consult Status Consult Status: Complete Follow-up type: Call as needed    Kristy Little 11/11/2018, 9:31 AM

## 2018-11-16 ENCOUNTER — Ambulatory Visit (INDEPENDENT_AMBULATORY_CARE_PROVIDER_SITE_OTHER): Payer: Medicaid Other | Admitting: Family Medicine

## 2018-11-16 ENCOUNTER — Other Ambulatory Visit: Payer: Self-pay

## 2018-11-16 ENCOUNTER — Encounter: Payer: Self-pay | Admitting: Family Medicine

## 2018-11-16 DIAGNOSIS — L03113 Cellulitis of right upper limb: Secondary | ICD-10-CM

## 2018-11-16 DIAGNOSIS — M7989 Other specified soft tissue disorders: Secondary | ICD-10-CM | POA: Diagnosis not present

## 2018-11-16 DIAGNOSIS — Z98891 History of uterine scar from previous surgery: Secondary | ICD-10-CM | POA: Diagnosis not present

## 2018-11-16 MED ORDER — CEPHALEXIN 500 MG PO CAPS: 500.0000 mg | ORAL_CAPSULE | Freq: Three times a day (TID) | ORAL | 0 refills | Status: DC

## 2018-11-16 NOTE — Progress Notes (Signed)
   Subjective:    Patient ID: Kristy Little is a 28 y.o. female presenting with Wound Check  on 11/16/2018  HPI: Asked to see pt. Due to reddened swollen arm. Unclear etiology. Not related to bite, exposure or IV. No fever. Feels tingling and is getting worse. Swelling has increased up to almost her elbow. She notes no other swelling. Began yesterday. Took Benadryl without help.  Review of Systems  Constitutional: Negative for chills and fever.  Respiratory: Negative for shortness of breath.   Cardiovascular: Negative for chest pain.  Gastrointestinal: Negative for abdominal pain, nausea and vomiting.  Genitourinary: Negative for dysuria.  Musculoskeletal: Positive for joint swelling.  Skin: Negative for rash.      Objective:    LMP 02/13/2018 (Approximate)  Physical Exam Constitutional:      General: She is not in acute distress.    Appearance: She is well-developed.  HENT:     Head: Normocephalic and atraumatic.  Eyes:     General: No scleral icterus. Neck:     Musculoskeletal: Neck supple.  Cardiovascular:     Rate and Rhythm: Normal rate.  Pulmonary:     Effort: Pulmonary effort is normal.  Abdominal:     Palpations: Abdomen is soft.  Musculoskeletal:       Arms:  Skin:    General: Skin is warm and dry.  Neurological:     Mental Status: She is alert and oriented to person, place, and time.         Assessment & Plan:   Problem List Items Addressed This Visit      Unprioritized   Status post primary low transverse cesarean section   Swelling of right hand and arm    Unclear etiology--will check u/s/Doppler to r/o DVT, treat presumptively for cellulitis, though this is not obvious. If does not improve, should go to ED or Urgent Care.      Relevant Medications   cephALEXin (KEFLEX) 500 MG capsule   Other Relevant Orders   [place] UPPER EXTREMITY DUPLEX RIGHT (NON-WBI)    Other Visit Diagnoses    Cellulitis of right upper extremity    -  Primary   Relevant Medications   cephALEXin (KEFLEX) 500 MG capsule   Other Relevant Orders   [place] UPPER EXTREMITY DUPLEX RIGHT (NON-WBI)      Total face-to-face time with patient: 15 minutes. Over 50% of encounter was spent on counseling and coordination of care. Return if symptoms worsen or fail to improve.  Kristy Little 11/16/2018 2:49 PM

## 2018-11-16 NOTE — Progress Notes (Signed)
Pt post c/s incision check. Incision well approximated without drainage, redness, or swelling.   Pt with swollen right hand that extends upwards to elbow. Area is reddened and warm to touch. Started yesterday. Pt has taken Benadryl and reports it did not help. Reports swelling is worse today. No injury reported and not able to see a bite from an insect. She reports her IV was in her left hand.   Pt is breast feeding. Reviewed that prolonged Benadryl use can effect milk supply.

## 2018-11-16 NOTE — Patient Instructions (Signed)

## 2018-11-16 NOTE — Assessment & Plan Note (Signed)
Unclear etiology--will check u/s/Doppler to r/o DVT, treat presumptively for cellulitis, though this is not obvious. If does not improve, should go to ED or Urgent Care.

## 2018-11-16 NOTE — Addendum Note (Signed)
Addended by: Donnamae Jude on: 11/16/2018 10:34 AM   Modules accepted: Orders

## 2018-11-16 NOTE — Addendum Note (Signed)
Addended by: Donnamae Jude on: 11/16/2018 02:57 PM   Modules accepted: Level of Service

## 2018-11-16 NOTE — Progress Notes (Signed)
LM with vascular extremity US

## 2018-11-20 ENCOUNTER — Telehealth: Payer: Self-pay

## 2018-11-20 DIAGNOSIS — M7989 Other specified soft tissue disorders: Secondary | ICD-10-CM

## 2018-11-20 DIAGNOSIS — L03113 Cellulitis of right upper limb: Secondary | ICD-10-CM

## 2018-11-20 NOTE — Addendum Note (Signed)
Addended by: Michel Harrow on: 11/20/2018 09:08 AM   Modules accepted: Orders

## 2018-11-20 NOTE — Telephone Encounter (Signed)
Scheduled pt an upper extremity duplex for tomorrow 11/21/18 @ 1300 @ [facility].  Attempted to contact pt unable to LM due to pt not having a VM box.  Called Brett Canales contact and I was informed that they would get in touch with the pt to contact the office.  I informed her that the pt has an appt scheduled for tomorrow at 1pm at [facility] if the pt could please give me a call.  She stated that she will call the pt and inform her.

## 2018-11-21 ENCOUNTER — Ambulatory Visit (HOSPITAL_COMMUNITY): Admission: RE | Admit: 2018-11-21 | Payer: Medicaid Other | Source: Ambulatory Visit

## 2018-11-27 ENCOUNTER — Telehealth: Payer: Self-pay | Admitting: Nurse Practitioner

## 2018-11-27 ENCOUNTER — Other Ambulatory Visit: Payer: Self-pay

## 2018-11-27 NOTE — Telephone Encounter (Signed)
The patient called in stating she would like a refill on the pain prescription as she in her terrible pain in her incision area. She also stated it doesn't look right anymore and feels like her stitches are stretching. The patient visits the [facility] on [address]. The patient stated the ibuprofen isn't working, she would like a refill on the other medication oxycodone.

## 2018-11-29 NOTE — Telephone Encounter (Signed)
Attempted to call pt to discuss pain and medications. Pt did not answer and voicemail not set up yet so unable to leave a message. Will send a My Chart message to pt.

## 2018-12-10 ENCOUNTER — Telehealth: Payer: Self-pay | Admitting: Family Medicine

## 2018-12-10 NOTE — Telephone Encounter (Signed)
Spoke to patient about her appointment on 9/1 @ 9:00. Patient instructed to wear a face mask for the entire appointment and no visitors are allowed with her during the visit. Patient screened for covid symptoms and denied having any

## 2018-12-11 ENCOUNTER — Other Ambulatory Visit: Payer: Self-pay

## 2018-12-11 ENCOUNTER — Ambulatory Visit (INDEPENDENT_AMBULATORY_CARE_PROVIDER_SITE_OTHER): Payer: Medicaid Other | Admitting: General Practice

## 2018-12-11 ENCOUNTER — Encounter: Payer: Self-pay | Admitting: General Practice

## 2018-12-11 VITALS — BP 117/85 | HR 68 | Ht 65.0 in | Wt 130.0 lb

## 2018-12-11 DIAGNOSIS — Z5189 Encounter for other specified aftercare: Secondary | ICD-10-CM

## 2018-12-11 NOTE — Progress Notes (Signed)
Patient presents to office today with concern about c-section incision. Patient delivered 7/31. She reports burning, stinging, & pulling sensations around incision. Incision is clean, dry & intact. Appears well healed. Discussed with patient burning, stinging and pulling sensations are all normal following a c-section due to muscles and nerves being cut. Discussed those sensations will improve but unfortunately it can take some time. Patient verbalized understanding & will follow up at postpartum visit.  Koren Bound RN BSN 12/11/18

## 2018-12-11 NOTE — Progress Notes (Signed)
Patient seen and assessed by nursing staff during this encounter. I have reviewed the chart and agree with the documentation and plan.  Alonni Heimsoth, Gerrie Nordmann, NP 12/11/2018 11:03 AM

## 2018-12-13 ENCOUNTER — Telehealth: Payer: Self-pay | Admitting: Obstetrics & Gynecology

## 2018-12-13 NOTE — Telephone Encounter (Signed)
Attempted to reach patient about appointment changes in the office. Was not able to leave a message.

## 2018-12-18 ENCOUNTER — Telehealth: Payer: Self-pay | Admitting: Student

## 2018-12-18 ENCOUNTER — Ambulatory Visit: Payer: Medicaid Other | Admitting: Student

## 2018-12-18 NOTE — Telephone Encounter (Signed)
Spoke with patient this morning about her postpartum visit. I informed her [facility] She will call them and get an appointment.

## 2018-12-24 ENCOUNTER — Ambulatory Visit: Payer: Medicaid Other | Admitting: Obstetrics & Gynecology

## 2019-02-24 ENCOUNTER — Encounter (HOSPITAL_COMMUNITY): Payer: Self-pay | Admitting: *Deleted

## 2019-02-24 ENCOUNTER — Other Ambulatory Visit: Payer: Self-pay

## 2019-02-24 DIAGNOSIS — R509 Fever, unspecified: Secondary | ICD-10-CM | POA: Diagnosis not present

## 2019-02-24 DIAGNOSIS — Z87891 Personal history of nicotine dependence: Secondary | ICD-10-CM | POA: Diagnosis not present

## 2019-02-24 DIAGNOSIS — Z79899 Other long term (current) drug therapy: Secondary | ICD-10-CM | POA: Insufficient documentation

## 2019-02-24 DIAGNOSIS — G43009 Migraine without aura, not intractable, without status migrainosus: Secondary | ICD-10-CM | POA: Insufficient documentation

## 2019-02-24 DIAGNOSIS — M7918 Myalgia, other site: Secondary | ICD-10-CM | POA: Diagnosis present

## 2019-02-24 NOTE — ED Triage Notes (Signed)
Body aches for past 2 days, pt admits to dancing Friday and not sure if her aches come from that.

## 2019-02-25 ENCOUNTER — Emergency Department (HOSPITAL_COMMUNITY)
Admission: EM | Admit: 2019-02-25 | Discharge: 2019-02-25 | Disposition: A | Discharge status: Home / Self Care | Payer: Medicaid Other | Attending: Emergency Medicine | Admitting: Emergency Medicine

## 2019-02-25 DIAGNOSIS — R509 Fever, unspecified: Secondary | ICD-10-CM

## 2019-02-25 DIAGNOSIS — G43009 Migraine without aura, not intractable, without status migrainosus: Secondary | ICD-10-CM

## 2019-02-25 MED ORDER — ACETAMINOPHEN 325 MG PO TABS
650.0000 mg | ORAL_TABLET | Freq: Once | ORAL | Status: AC
  Administered 2019-02-25: 650 mg via ORAL
  Filled 2019-02-25: qty 2

## 2019-02-25 MED ORDER — IBUPROFEN 400 MG PO TABS
600.0000 mg | ORAL_TABLET | Freq: Once | ORAL | Status: AC
  Administered 2019-02-25: 02:00:00 600 mg via ORAL
  Filled 2019-02-25: qty 2

## 2019-02-25 MED ORDER — DIPHENHYDRAMINE HCL 50 MG/ML IJ SOLN
25.0000 mg | Freq: Once | INTRAMUSCULAR | Status: AC
  Administered 2019-02-25: 02:00:00 25 mg via INTRAMUSCULAR
  Filled 2019-02-25: qty 1

## 2019-02-25 MED ORDER — METOCLOPRAMIDE HCL 5 MG/ML IJ SOLN
10.0000 mg | Freq: Once | INTRAMUSCULAR | Status: AC
  Administered 2019-02-25: 02:00:00 10 mg via INTRAMUSCULAR
  Filled 2019-02-25: qty 2

## 2019-02-25 NOTE — ED Provider Notes (Signed)
[facility] EMERGENCY DEPARTMENT Provider Note   CSN: [id number] Arrival date & time: 02/24/19  2141   Time seen 1:30 AM  History   Chief Complaint Chief Complaint  Patient presents with  . Generalized Body Aches    HPI Kristy Little is a 28 y.o. female.     HPI patient reports she started feeling bad 2 days ago and "feels horrible".  She has diffuse body aches and has a headache.  Her headache is left-sided and described as throbbing.  She states she has photophobia and phonophobia.  She denies any change in her vision.  She denies sore throat, rhinorrhea, nausea, vomiting, diarrhea, loss of taste or smell.  She denies being around anybody else who is sick except for her infant son that she also has here for low-grade fever of 100.  She states at home she had a fever of 101.6 but denies chills.  She denies dysuria or frequency or any urinary symptoms.  She states she has had headaches before but not quite like this.  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  PCP Patient, No Pcp Per   Past Medical History:  Diagnosis Date  . Chlamydia   . Infection    UTI  . Migraine     Patient Active Problem List   Diagnosis Date Noted  . Swelling of right hand and arm 11/16/2018  . Status post primary low transverse cesarean section 11/09/2018  . Anemia 11/08/2018  . Bipolar affective (HCC) 11/08/2018  . Tobacco abuse 11/08/2018    Past Surgical History:  Procedure Laterality Date  . CESAREAN SECTION N/A 11/09/2018   Procedure: CESAREAN SECTION;  Surgeon: Reva Bores, MD;  Location: [facility] LD ORS;  Service: Obstetrics;  Laterality: N/A;  . INDUCED ABORTION       OB History    Gravida  4   Para      Term      Preterm      AB  3   Living  0     SAB  3   TAB  0   Ectopic      Multiple      Live Births               Home Medications    Prior to Admission medications   Medication Sig  Start Date End Date Taking? Authorizing Provider  cephALEXin (KEFLEX) 500 MG capsule Take 1 capsule (500 mg total) by mouth 3 (three) times daily. 11/16/18   Reva Bores, MD  ibuprofen (ADVIL) 800 MG tablet Take 1 tablet (800 mg total) by mouth every 6 (six) hours. 11/11/18   Leftwich-Kirby, Wilmer Floor, CNM  oxyCODONE-acetaminophen (PERCOCET) 5-325 MG tablet Take 1-2 tablets by mouth every 6 (six) hours as needed for severe pain. 11/11/18 11/11/19  Leftwich-Kirby, Wilmer Floor, CNM  Prenatal Vit-Fe Fumarate-FA (MULTIVITAMIN-PRENATAL) 27-0.8 MG TABS tablet Take 1 tablet by mouth daily. 06/02/18   Leftwich-Kirby, Wilmer Floor, CNM  simethicone (MYLICON) 80 MG chewable tablet Chew 1 tablet (80 mg total) by mouth 4 (four) times daily as needed for flatulence. Patient not taking: Reported on 11/09/2018 10/11/18   Aviva Signs, CNM    Family History Family History  Problem Relation Age of Onset  . Stroke Mother   . Hypertension Mother   . Diabetes Paternal Aunt     Social History Social History   Tobacco Use  .  Smoking status: Former Smoker    Types: Cigarettes, Cigars  . Smokeless tobacco: Never Used  . Tobacco comment: 2016  Substance Use Topics  . Alcohol use: No  . Drug use: No  employed   Allergies   Latex   Review of Systems Review of Systems  All other systems reviewed and are negative.    Physical Exam Updated Vital Signs BP (!) 120/91 (BP Location: Right Arm)   Pulse 85   Temp (!) 100.5 F (38.1 C) (Oral)   Resp 14   Ht 5\' 5"  (1.651 m)   Wt 54.4 kg   SpO2 98%   BMI 19.97 kg/m   Physical Exam Vitals signs and nursing note reviewed.  Constitutional:      General: She is not in acute distress.    Appearance: Normal appearance. She is well-developed. She is not ill-appearing or toxic-appearing.  HENT:     Head: Normocephalic and atraumatic.     Right Ear: External ear normal.     Left Ear: External ear normal.     Nose: Nose normal. No mucosal edema or rhinorrhea.      Mouth/Throat:     Mouth: Mucous membranes are moist.     Dentition: No dental abscesses.     Pharynx: No oropharyngeal exudate, posterior oropharyngeal erythema or uvula swelling.  Eyes:     Extraocular Movements: Extraocular movements intact.     Conjunctiva/sclera: Conjunctivae normal.     Pupils: Pupils are equal, round, and reactive to light.  Neck:     Musculoskeletal: Full passive range of motion without pain, normal range of motion and neck supple.  Cardiovascular:     Rate and Rhythm: Normal rate and regular rhythm.     Heart sounds: Normal heart sounds. No murmur. No friction rub. No gallop.   Pulmonary:     Effort: Pulmonary effort is normal. No respiratory distress.     Breath sounds: Normal breath sounds. No wheezing, rhonchi or rales.  Chest:     Chest wall: No tenderness or crepitus.  Abdominal:     General: Bowel sounds are normal. There is no distension.     Palpations: Abdomen is soft.     Tenderness: There is no abdominal tenderness. There is no guarding or rebound.  Musculoskeletal: Normal range of motion.        General: No tenderness.     Comments: Moves all extremities well.   Skin:    General: Skin is warm and dry.     Coloration: Skin is not pale.     Findings: No erythema or rash.  Neurological:     General: No focal deficit present.     Mental Status: She is alert and oriented to person, place, and time.     Cranial Nerves: No cranial nerve deficit.  Psychiatric:        Mood and Affect: Mood normal. Mood is not anxious.        Speech: Speech normal.        Behavior: Behavior normal.        Thought Content: Thought content normal.      ED Treatments / Results  Labs (all labs ordered are listed, but only abnormal results are displayed) Labs Reviewed - No data to display  EKG None  Radiology No results found.  Procedures Procedures (including critical care time)  Medications Ordered in ED Medications  metoCLOPramide (REGLAN) injection  10 mg (10 mg Intramuscular Given 02/25/19 0200)  diphenhydrAMINE (BENADRYL) injection 25  mg (25 mg Intramuscular Given 02/25/19 0200)  ibuprofen (ADVIL) tablet 600 mg (600 mg Oral Given 02/25/19 0158)  acetaminophen (TYLENOL) tablet 650 mg (650 mg Oral Given 02/25/19 0158)     Initial Impression / Assessment and Plan / ED Course  I have reviewed the triage vital signs and the nursing notes.  Pertinent labs & imaging results that were available during my care of the patient were reviewed by me and considered in my medical decision making (see chart for details).       Patient has a low-grade fever in ER 100.5.  I offered to do Covid testing on her but she refused.  Her headache sounds like a migraine type headache.  She was given the option of IV or IM medications and she chose IM.  She was given migraine cocktail of Reglan and Benadryl IM.  Recheck at 3:00 AM HA is better, feels ready to be discharged  Final Clinical Impressions(s) / ED Diagnoses   Final diagnoses:  Fever, unspecified fever cause  Migraine without aura and without status migrainosus, not intractable    ED Discharge Orders    None    OTC ibuprofen and acetaminophen  Plan discharge  Devoria AlbeIva Margarita Croke, MD, Concha PyoFACEP    Damond Borchers, MD 02/25/19 [phone]

## 2019-02-25 NOTE — Discharge Instructions (Addendum)
Drink plenty of fluids.  Monitor yourself for fever.  You can take ibuprofen 600 mg plus acetaminophen 650 mg every 6 hours as needed for fever.  Recheck if you develop a specific symptoms such as cough, sore throat, vomiting.  You should quarantine yourself as long as you are having the fever.  Consider doing a Covid test if you are not better in the next couple of days.

## 2019-08-28 ENCOUNTER — Encounter (HOSPITAL_COMMUNITY): Payer: Self-pay | Admitting: *Deleted

## 2019-08-28 ENCOUNTER — Other Ambulatory Visit: Payer: Self-pay

## 2019-08-28 ENCOUNTER — Emergency Department (HOSPITAL_COMMUNITY)
Admission: EM | Admit: 2019-08-28 | Discharge: 2019-08-28 | Disposition: A | Discharge status: Home / Self Care | Payer: Medicaid Other | Attending: Emergency Medicine | Admitting: Emergency Medicine

## 2019-08-28 DIAGNOSIS — Z87891 Personal history of nicotine dependence: Secondary | ICD-10-CM | POA: Insufficient documentation

## 2019-08-28 DIAGNOSIS — H53149 Visual discomfort, unspecified: Secondary | ICD-10-CM | POA: Diagnosis not present

## 2019-08-28 DIAGNOSIS — R197 Diarrhea, unspecified: Secondary | ICD-10-CM | POA: Diagnosis not present

## 2019-08-28 DIAGNOSIS — U071 COVID-19: Secondary | ICD-10-CM | POA: Insufficient documentation

## 2019-08-28 DIAGNOSIS — Z91040 Latex allergy status: Secondary | ICD-10-CM | POA: Insufficient documentation

## 2019-08-28 DIAGNOSIS — R519 Headache, unspecified: Secondary | ICD-10-CM

## 2019-08-28 DIAGNOSIS — R0981 Nasal congestion: Secondary | ICD-10-CM | POA: Diagnosis not present

## 2019-08-28 DIAGNOSIS — R42 Dizziness and giddiness: Secondary | ICD-10-CM | POA: Insufficient documentation

## 2019-08-28 DIAGNOSIS — Z20822 Contact with and (suspected) exposure to covid-19: Secondary | ICD-10-CM

## 2019-08-28 LAB — BASIC METABOLIC PANEL
Anion gap: 9 (ref 5–15)
BUN: 11 mg/dL (ref 6–20)
CO2: 29 mmol/L (ref 22–32)
Calcium: 9.6 mg/dL (ref 8.9–10.3)
Chloride: 100 mmol/L (ref 98–111)
Creatinine, Ser: 0.67 mg/dL (ref 0.44–1.00)
GFR calc Af Amer: >60 mL/min (ref >60)
GFR calc non Af Amer: >60 mL/min (ref >60)
Glucose, Bld: 95 mg/dL (ref 70–99)
Potassium: 3.9 mmol/L (ref 3.5–5.1)
Sodium: 138 mmol/L (ref 135–145)

## 2019-08-28 LAB — CBC
HCT: 40 % (ref 36.0–46.0)
Hemoglobin: 13.1 g/dL (ref 12.0–15.0)
MCH: 30.4 pg (ref 26.0–34.0)
MCHC: 32.8 g/dL (ref 30.0–36.0)
MCV: 92.8 fL (ref 80.0–100.0)
Platelets: 246 10*3/uL (ref 150–400)
RBC: 4.31 MIL/uL (ref 3.87–5.11)
RDW: 12.1 % (ref 11.5–15.5)
WBC: 3.4 10*3/uL — ABNORMAL LOW (ref 4.0–10.5)
nRBC: 0 % (ref 0.0–0.2)

## 2019-08-28 LAB — SARS CORONAVIRUS 2 BY RT PCR (HOSPITAL ORDER, PERFORMED IN ~~LOC~~ HOSPITAL LAB): SARS Coronavirus 2: POSITIVE — AB

## 2019-08-28 LAB — POC URINE PREG, ED: Preg Test, Ur: NEGATIVE

## 2019-08-28 MED ORDER — METOCLOPRAMIDE HCL 5 MG/ML IJ SOLN
10.0000 mg | Freq: Once | INTRAMUSCULAR | Status: AC
  Administered 2019-08-28: 10 mg via INTRAVENOUS
  Filled 2019-08-28: qty 2

## 2019-08-28 MED ORDER — FLUTICASONE PROPIONATE 50 MCG/ACT NA SUSP
2.0000 | Freq: Every day | NASAL | Status: DC
  Administered 2019-08-28: 2 via NASAL
  Filled 2019-08-28: qty 16

## 2019-08-28 MED ORDER — LORATADINE 10 MG PO TABS: 10.0000 mg | ORAL_TABLET | Freq: Every day | ORAL | 0 refills | Status: DC

## 2019-08-28 MED ORDER — DIPHENHYDRAMINE HCL 50 MG/ML IJ SOLN
12.5000 mg | Freq: Once | INTRAMUSCULAR | Status: AC
  Administered 2019-08-28: 12.5 mg via INTRAVENOUS
  Filled 2019-08-28: qty 1

## 2019-08-28 MED ORDER — KETOROLAC TROMETHAMINE 15 MG/ML IJ SOLN
15.0000 mg | Freq: Once | INTRAMUSCULAR | Status: AC
  Administered 2019-08-28: 15 mg via INTRAVENOUS
  Filled 2019-08-28: qty 1

## 2019-08-28 MED ORDER — SODIUM CHLORIDE 0.9 % IV BOLUS
1000.0000 mL | Freq: Once | INTRAVENOUS | Status: AC
  Administered 2019-08-28: 1000 mL via INTRAVENOUS

## 2019-08-28 NOTE — ED Triage Notes (Addendum)
Pt c/o feeling lightheaded and weak x 2 weeks. Pt states she feels "swimmy headed and like my brain is on fire".   Pt does also report unusual  diarrhea 2-3 times daily for the past couple of days.  Pt reports her periods have been very irregular for the past 2 months. Pt describes them as coming on and off a couple times a month. Recent pregnancy 10 months ago.

## 2019-08-28 NOTE — ED Notes (Signed)
Patient made aware of positive Covid results.

## 2019-08-28 NOTE — ED Provider Notes (Signed)
[facility] EMERGENCY DEPARTMENT Provider Note   CSN: [id number] Arrival date & time: 08/28/19  1139     History Chief Complaint  Patient presents with  . Headache    Kristy Little is a 29 y.o. female.  HPI    Pt is a 29 y/o female with a h/o chlamydia, infection, migraines, bipolar disorder, tobacco use, who presents to the ED today for eval of a headache/facial pain that has been ongoing for 2 weeks. Pain is constant. States pain is mainly located to the forehead and face/nose. Describes pain as a burning sensation and like she "went under water and got water in my nose". Has h/o migraines but this feels different. Feels similar to sinus headache. She denies any rhinorrhea or nasal congestion but states that her nose is stopped up and hurts when she breathes through it.  She has tried excedrin, has tried pushing fluids, and ibuprofen without relief of symptoms. She also state she thought her symptoms are related to her allergies so she tried taking benadryl without relief.  Denies fevers, dizziness, visual changes, unilateral numbness/weakness. She does have some photophobia and lightheadedness upon standing. She has also had some intermittent diarrhea for the last few days.   She gave birth 10 months ago. She is not currently breastfeeding. She has recently been around someone who tested positive for COVID.   Past Medical History:  Diagnosis Date  . Chlamydia   . Infection    UTI  . Migraine     Patient Active Problem List   Diagnosis Date Noted  . Swelling of right hand and arm 11/16/2018  . Status post primary low transverse cesarean section 11/09/2018  . Anemia 11/08/2018  . Bipolar affective (HCC) 11/08/2018  . Tobacco abuse 11/08/2018    Past Surgical History:  Procedure Laterality Date  . CESAREAN SECTION N/A 11/09/2018   Procedure: CESAREAN SECTION;  Surgeon: Reva Bores, MD;  Location: [facility] LD ORS;  Service: Obstetrics;  Laterality: N/A;  . INDUCED ABORTION        OB History    Gravida  4   Para      Term      Preterm      AB  3   Living  0     SAB  3   TAB  0   Ectopic      Multiple      Live Births              Family History  Problem Relation Age of Onset  . Stroke Mother   . Hypertension Mother   . Diabetes Paternal Aunt     Social History   Tobacco Use  . Smoking status: Former Smoker    Types: Cigarettes, Cigars  . Smokeless tobacco: Never Used  . Tobacco comment: 2016  Substance Use Topics  . Alcohol use: No  . Drug use: No    Home Medications Prior to Admission medications   Medication Sig Start Date End Date Taking? Authorizing Provider  cephALEXin (KEFLEX) 500 MG capsule Take 1 capsule (500 mg total) by mouth 3 (three) times daily. 11/16/18   Reva Bores, MD  ibuprofen (ADVIL) 800 MG tablet Take 1 tablet (800 mg total) by mouth every 6 (six) hours. 11/11/18   Leftwich-Kirby, Wilmer Floor, CNM  loratadine (CLARITIN) 10 MG tablet Take 1 tablet (10 mg total) by mouth daily. 08/28/19 09/27/19  Davonte Siebenaler S, PA-C  oxyCODONE-acetaminophen (PERCOCET) 5-325 MG tablet Take 1-2 tablets  by mouth every 6 (six) hours as needed for severe pain. 11/11/18 11/11/19  Leftwich-Kirby, Kathie Dike, CNM  Prenatal Vit-Fe Fumarate-FA (MULTIVITAMIN-PRENATAL) 27-0.8 MG TABS tablet Take 1 tablet by mouth daily. 06/02/18   Leftwich-Kirby, Kathie Dike, CNM  simethicone (MYLICON) 80 MG chewable tablet Chew 1 tablet (80 mg total) by mouth 4 (four) times daily as needed for flatulence. Patient not taking: Reported on 11/09/2018 10/11/18   Seabron Spates, CNM    Allergies    Latex  Review of Systems   Review of Systems  Constitutional: Negative for chills and fever.  HENT: Positive for sinus pressure and sinus pain. Negative for congestion, ear pain, rhinorrhea and sore throat.   Eyes: Positive for photophobia. Negative for visual disturbance.  Respiratory: Negative for cough and shortness of breath.   Cardiovascular: Negative for chest  pain.  Gastrointestinal: Positive for diarrhea. Negative for abdominal pain, constipation, nausea and vomiting.  Genitourinary: Negative for dysuria and hematuria.  Musculoskeletal: Negative for back pain.  Skin: Negative for rash.  Neurological: Positive for light-headedness and headaches. Negative for dizziness and numbness.  All other systems reviewed and are negative.   Physical Exam Updated Vital Signs BP 120/85 (BP Location: Right Arm)   Pulse (!) 107   Temp 98.4 F (36.9 C) (Oral)   Ht 5\' 4"  (1.626 m)   Wt 54.4 kg   LMP 08/26/2019   SpO2 100%   Breastfeeding No   BMI 20.60 kg/m   Physical Exam Vitals and nursing note reviewed.  Constitutional:      General: She is not in acute distress.    Appearance: She is well-developed.  HENT:     Head: Normocephalic and atraumatic.     Comments: TTP to the frontal and maxillary sinuses bilaterally which reproduces pain    Nose:     Comments: bilat nasal turbinates are swollen    Mouth/Throat:     Mouth: Mucous membranes are moist.  Eyes:     Extraocular Movements: Extraocular movements intact.     Right eye: No nystagmus.     Left eye: No nystagmus.     Conjunctiva/sclera: Conjunctivae normal.     Pupils: Pupils are equal, round, and reactive to light.     Comments: +photophobia.   Cardiovascular:     Rate and Rhythm: Normal rate and regular rhythm.     Heart sounds: No murmur.  Pulmonary:     Effort: Pulmonary effort is normal. No respiratory distress.     Breath sounds: Normal breath sounds. No wheezing, rhonchi or rales.  Abdominal:     Palpations: Abdomen is soft.     Tenderness: There is no abdominal tenderness.  Musculoskeletal:     Cervical back: Neck supple.  Skin:    General: Skin is warm and dry.  Neurological:     Mental Status: She is alert.     Comments: Mental Status:  Alert, thought content appropriate, able to give a coherent history. Speech fluent without evidence of aphasia. Able to follow 2 step  commands without difficulty.  Cranial Nerves:  II:  pupils equal, round, reactive to light III,IV, VI: ptosis not present, extra-ocular motions intact bilaterally  V,VII: smile symmetric, facial light touch sensation equal VIII: hearing grossly normal to voice  X: uvula elevates symmetrically  XI: bilateral shoulder shrug symmetric and strong XII: midline tongue extension without fassiculations Motor:  Normal tone. 5/5 strength of BUE and BLE major muscle groups including strong and equal grip strength and dorsiflexion/plantar flexion  Sensory: light touch normal in all extremities. Cerebellar: normal finger-to-nose with bilateral upper extremities     ED Results / Procedures / Treatments   Labs (all labs ordered are listed, but only abnormal results are displayed) Labs Reviewed  CBC - Abnormal; Notable for the following components:      Result Value   WBC 3.4 (*)    All other components within normal limits  SARS CORONAVIRUS 2 BY RT PCR (HOSPITAL ORDER, PERFORMED IN [facility])  BASIC METABOLIC PANEL  POC URINE PREG, ED    EKG None  Radiology No results found.  Procedures Procedures (including critical care time)  Medications Ordered in ED Medications  fluticasone (FLONASE) 50 MCG/ACT nasal spray 2 spray (2 sprays Each Nare Given 08/28/19 1348)  sodium chloride 0.9 % bolus 1,000 mL (1,000 mLs Intravenous New Bag/Given 08/28/19 1344)  metoCLOPramide (REGLAN) injection 10 mg (10 mg Intravenous Given 08/28/19 1346)  diphenhydrAMINE (BENADRYL) injection 12.5 mg (12.5 mg Intravenous Given 08/28/19 1348)  ketorolac (TORADOL) 15 MG/ML injection 15 mg (15 mg Intravenous Given 08/28/19 1347)    ED Course  I have reviewed the triage vital signs and the nursing notes.  Pertinent labs & imaging results that were available during my care of the patient were reviewed by me and considered in my medical decision making (see chart for details).    MDM  Rules/Calculators/A&P                      29 y/o F presenting with headache. H/o migraines. Is describing some nasal symptoms and sinus pressure as well.   Marginally tachycardic on arrival, VS otherwise reassuring. Neuro exam is wnl. She does have swelling to the nasal turbinates bilaterally and has TTP to the frontal and maxillary sinuses.   Reviewed/interpeted labs CBC reveals leukopenia, otherwise reassuring BMP with normal electrolytes, liver and kidney function Urine preg neg COVID test pending at the time discharge.  Pt was given IVF and a migraine cocktail. She was also given fluticasone nasal spray. On reassessment, she feels much improved. States she thinks she was dehydrated. She no longer has a headache. Discussed likely diagnosis of viral sinusitis, vs possible covid, vs other URI or allergies causing sxs. Will tx with fluticasone and claritin. Advised on quarantine measures if COVID test is positive. Advised on return precautions. She voices understanding of the plan and reasons to return. All questions answered, pt stable for d/c.    ----  Kristy Little was evaluated in Emergency Department on 08/28/2019 for the symptoms described in the history of present illness. She was evaluated in the context of the global COVID-19 pandemic, which necessitated consideration that the patient might be at risk for infection with the SARS-CoV-2 virus that causes COVID-19. Institutional protocols and algorithms that pertain to the evaluation of patients at risk for COVID-19 are in a state of rapid change based on information released by regulatory bodies including the CDC and federal and state organizations. These policies and algorithms were followed during the patient's care in the ED.  Final Clinical Impression(s) / ED Diagnoses Final diagnoses:  Sinus headache  Person under investigation for COVID-19    Rx / DC Orders ED Discharge Orders         Ordered    loratadine (CLARITIN) 10  MG tablet  Daily     08/28/19 1515           Saarah Dewing S, PA-C 08/28/19 1517  Gerhard Munch, MD 08/28/19 2109

## 2019-08-28 NOTE — Discharge Instructions (Signed)
Use two sprays of the fluticasone nasal spray every morning to help with nasal congestion and sinus pressure. You were also given a prescription for claritin to take daily.   You were tested for COVID today. You will be contacted if the results are positive. If there results are positive, you should be isolated for at least 7 days since the onset of your symptoms AND >72 hours after symptoms resolution (absence of fever without the use of fever reducing medicaiton and improvement in respiratory symptoms), whichever is longer  Please follow up with your primary care provider within 5-7 days for re-evaluation of your symptoms. If you do not have a primary care provider, information for a healthcare clinic has been provided for you to make arrangements for follow up care. Please return to the emergency department for any new or worsening symptoms.

## 2019-09-03 ENCOUNTER — Ambulatory Visit: Payer: Medicaid Other | Attending: Internal Medicine

## 2019-09-03 ENCOUNTER — Other Ambulatory Visit: Payer: Self-pay

## 2019-09-03 DIAGNOSIS — Z20822 Contact with and (suspected) exposure to covid-19: Secondary | ICD-10-CM

## 2019-09-04 LAB — SARS-COV-2, NAA 2 DAY TAT

## 2019-09-04 LAB — NOVEL CORONAVIRUS, NAA: SARS-CoV-2, NAA: NOT DETECTED

## 2020-06-02 ENCOUNTER — Ambulatory Visit (INDEPENDENT_AMBULATORY_CARE_PROVIDER_SITE_OTHER): Payer: Medicaid Other | Admitting: General Practice

## 2020-06-02 ENCOUNTER — Other Ambulatory Visit: Payer: Self-pay

## 2020-06-02 DIAGNOSIS — Z3201 Encounter for pregnancy test, result positive: Secondary | ICD-10-CM

## 2020-06-02 LAB — POCT PREGNANCY, URINE: Preg Test, Ur: POSITIVE — AB

## 2020-06-02 MED ORDER — PRENATAL PLUS 27-1 MG PO TABS: 1.0000 | ORAL_TABLET | Freq: Every day | ORAL | 11 refills | Status: DC

## 2020-06-02 NOTE — Progress Notes (Signed)
Patient came into office and dropped off urine sample for UPT. UPT +.  Called patient at home. She reports first positive home test on Friday. LMP 04/25/20 EDD 01/30/21 [redacted]w[redacted]d. Patient reports minimal nausea. Advised of PNV Rx sent to pharmacy and to call us back if she needs medication for nausea. Discussed front office staff will call her back with initial OB visit appt.  Chase Caller RN BSN 06/02/20

## 2020-06-03 NOTE — Progress Notes (Signed)
I have reviewed the chart and agree with nursing staff's documentation of this patient's encounter.  Dawanda Mapel, CNM 06/03/2020 8:26 PM   

## 2020-06-30 ENCOUNTER — Telehealth (INDEPENDENT_AMBULATORY_CARE_PROVIDER_SITE_OTHER): Payer: Medicaid Other

## 2020-06-30 DIAGNOSIS — Z3491 Encounter for supervision of normal pregnancy, unspecified, first trimester: Secondary | ICD-10-CM

## 2020-06-30 NOTE — Progress Notes (Signed)
Called Pt for New OB Intake, Pt states that she forgot & is on her way to work. She states will make sure that she's off to come in at her New OB appt. that's on 07/07/20 @ 9:55a with Dow Adolph.

## 2020-07-07 ENCOUNTER — Encounter: Payer: Medicaid Other | Admitting: Medical

## 2020-07-24 IMAGING — US US OB TRANSVAGINAL
1 series · 15 of 28 positions shown · non-contrast
Comparison: 03/21/2018

CLINICAL DATA: Vaginal bleeding.

EXAM:
TRANSVAGINAL OB ULTRASOUND
TECHNIQUE: Transvaginal ultrasound was performed for complete evaluation of the
gestation as well as the maternal uterus, adnexal regions, and
pelvic cul-de-sac.

[Series 1: us ob transvaginal · 15 of 42 slices shown]
[im 1/42]
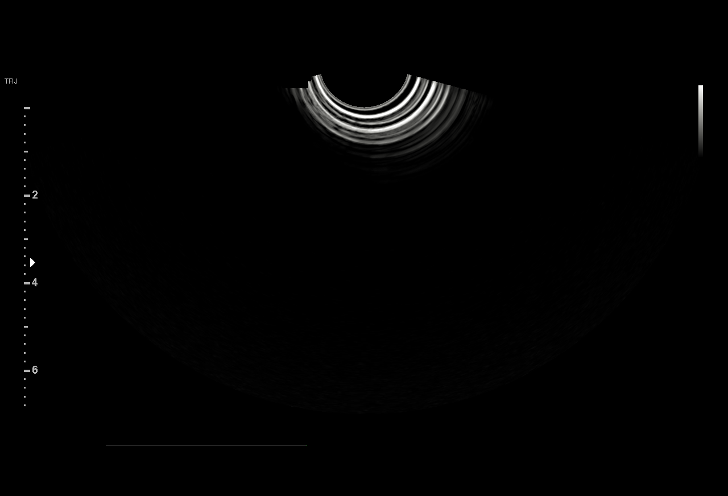
[im 4/42]
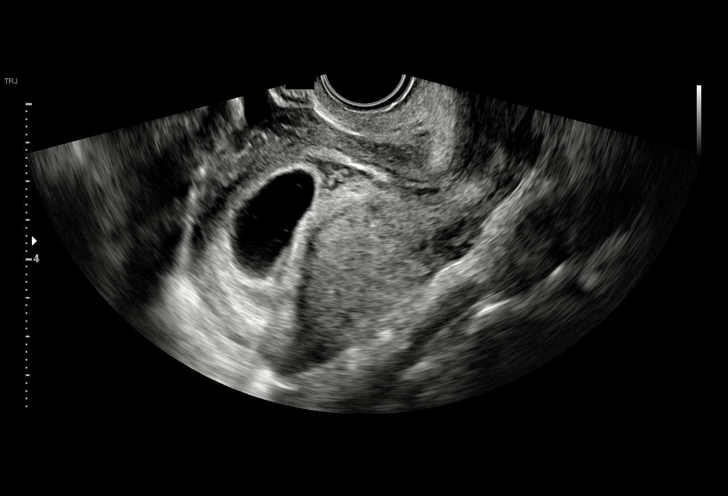
[im 7/42]
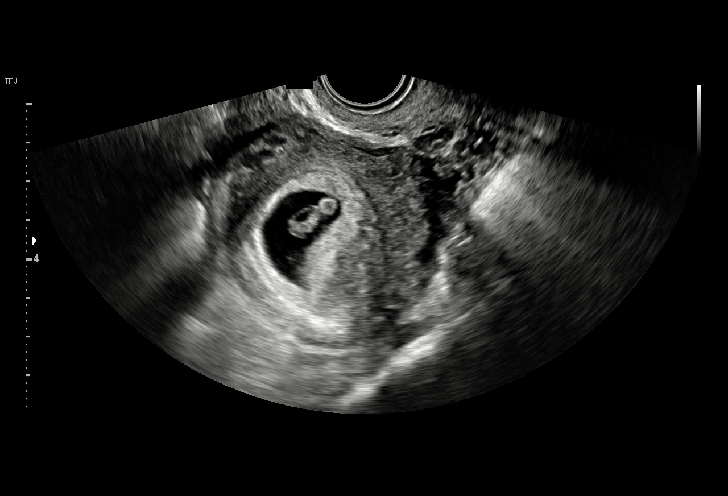
[im 10/42]
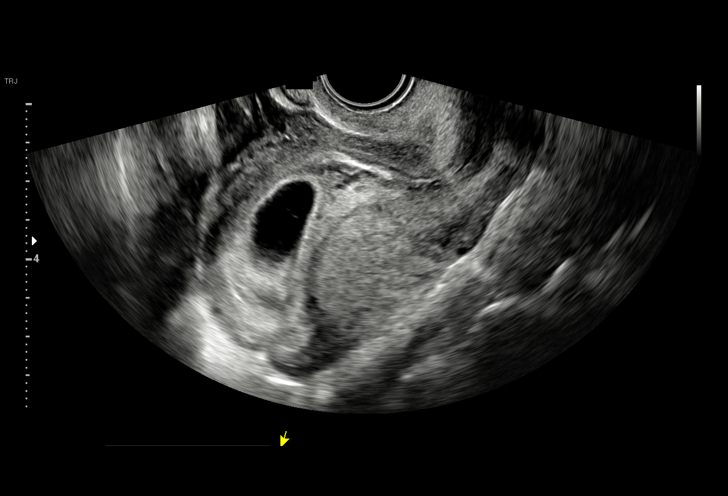
[im 13/42]
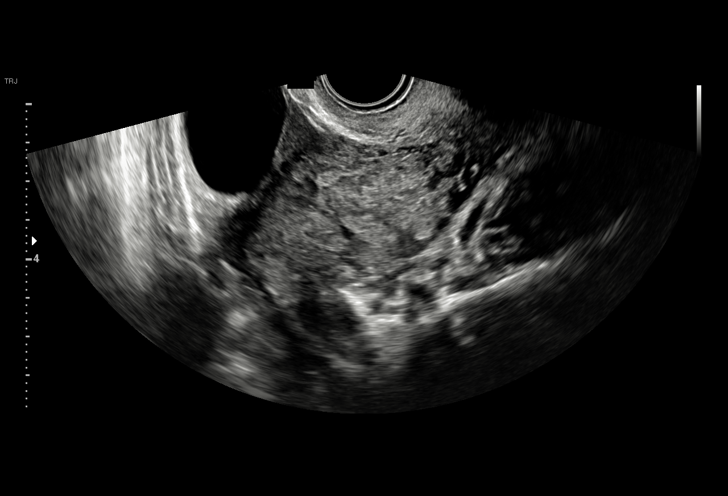
[im 16/42]
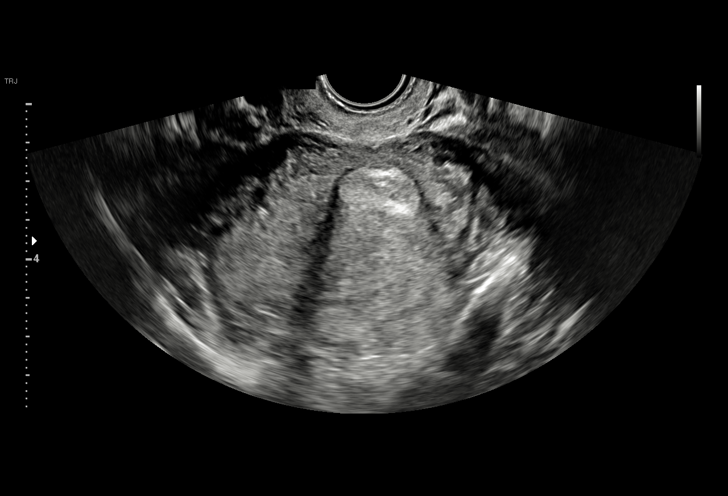
[im 19/42]
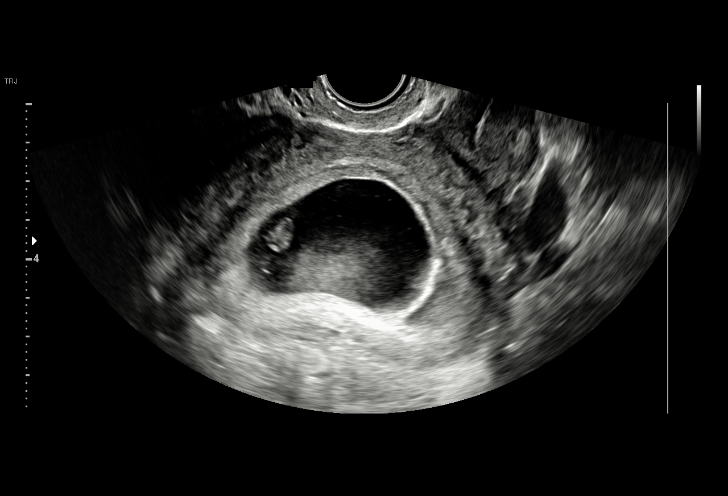
[im 22/42]
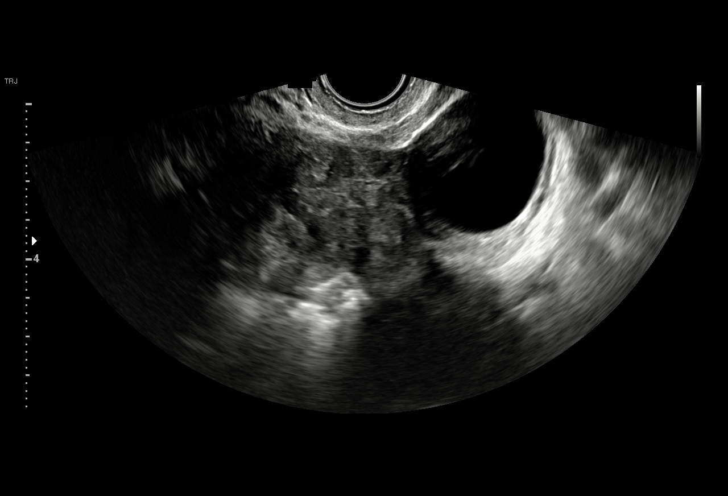
[im 23/42]
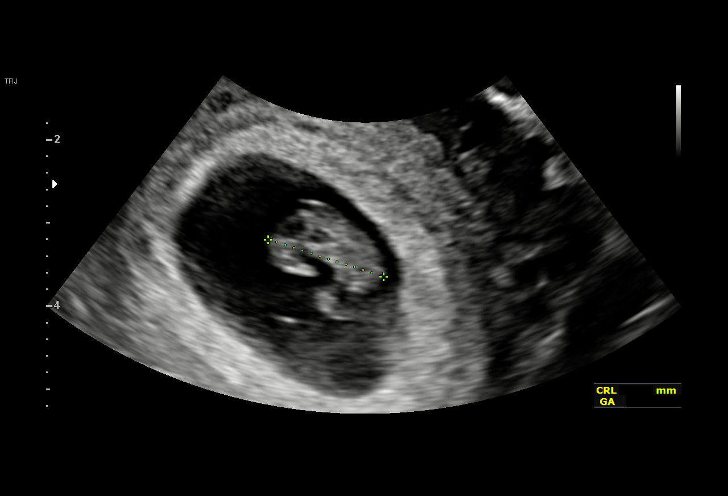
[im 26/42]
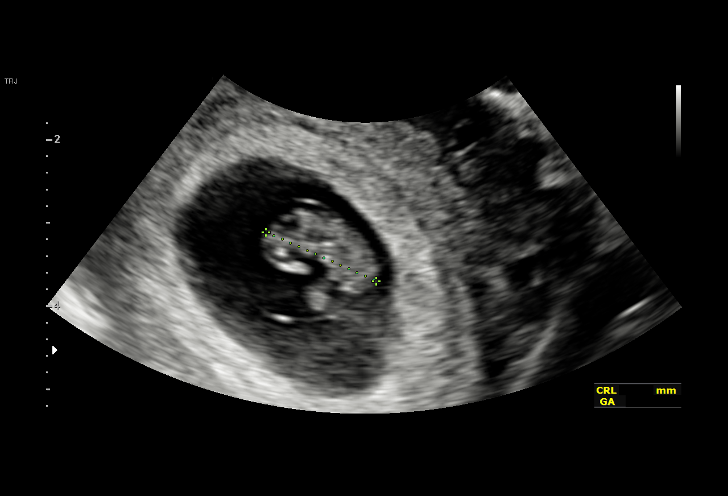
[im 29/42]
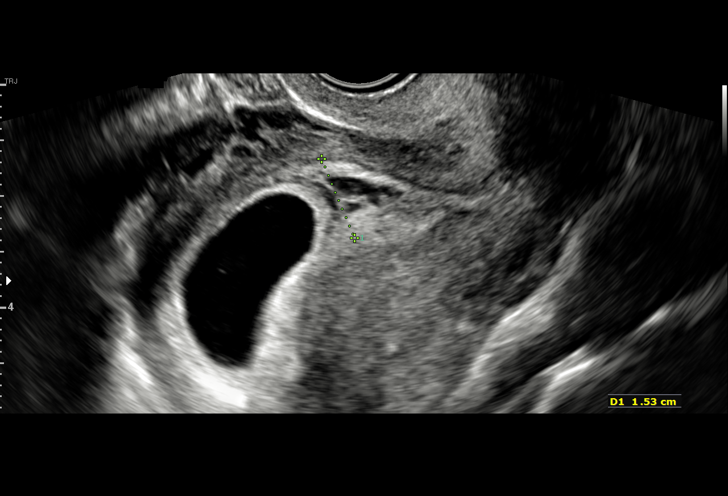
[im 32/42]
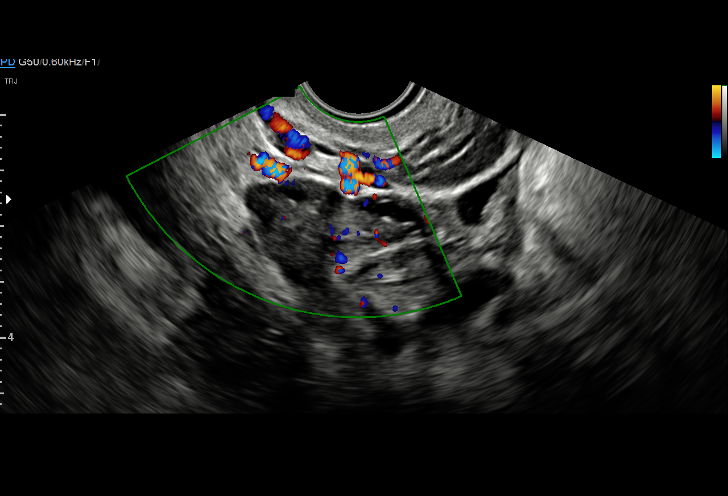
[im 35/42]
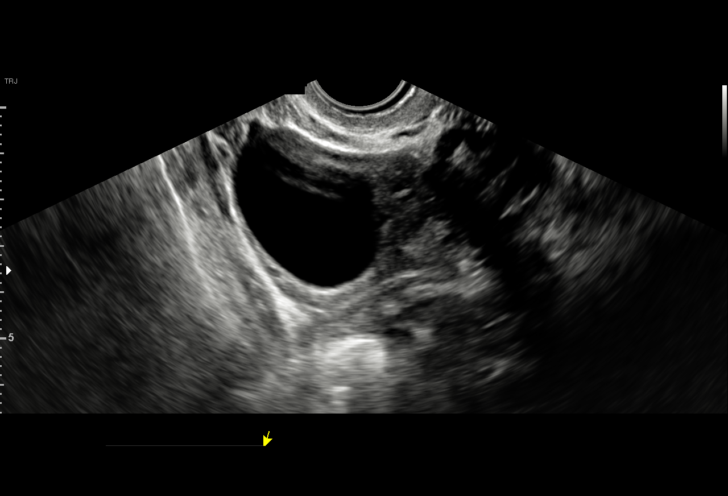
[im 38/42]
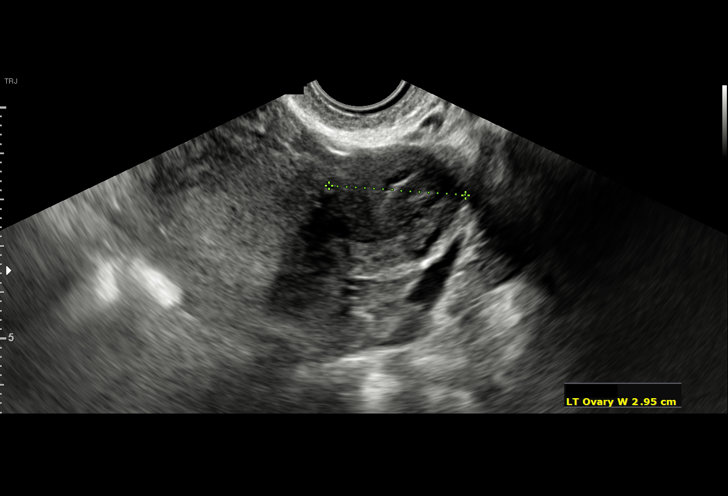
[im 42/42]
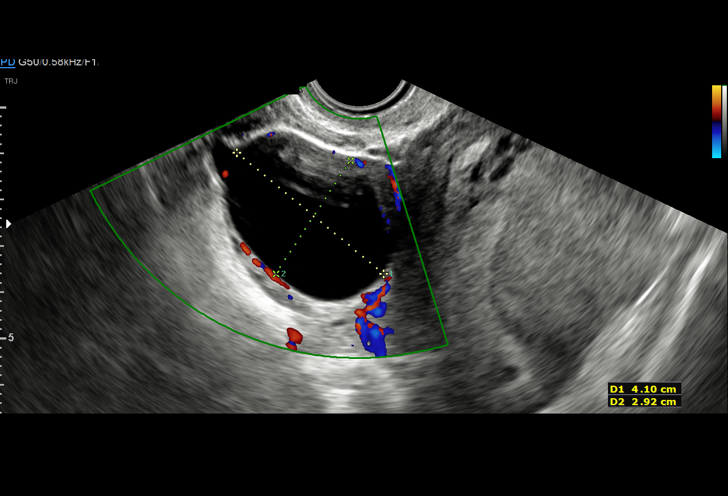

[15 of 28 positions shown; findings below may reference images not displayed]

FINDINGS: Intrauterine gestational sac: Single

Yolk sac:  Visualized.

Embryo:  Visualized.

Cardiac Activity: Visualized.

Heart Rate: 144 bpm

CRL:   14.4 mm   7 w 5 d                  US EDC: 11/15/2018

Maternal uterus/adnexae:

Subchorionic hemorrhage: Small.

Right ovary: Normal

Left ovary: Left ovary cyst is again identified measuring 4.1 x
x 2.9 cm. This appears anechoic with increased through transmission.
No mural nodularity or irregular internal septation.

Other :None

Free fluid:  None
IMPRESSION: 1. Single living intrauterine gestation with an estimated
gestational age of 7 weeks and 5 days.
2. Small subchorionic hemorrhage.
3. Unchanged left ovary cysts.

## 2021-01-30 IMAGING — US US MFM OB LIMITED
1 series · 15 of 28 positions shown · non-contrast
Comparison: none

[Series 1: us mfm ob limited · 15 of 42 slices shown]
[im 1/42]
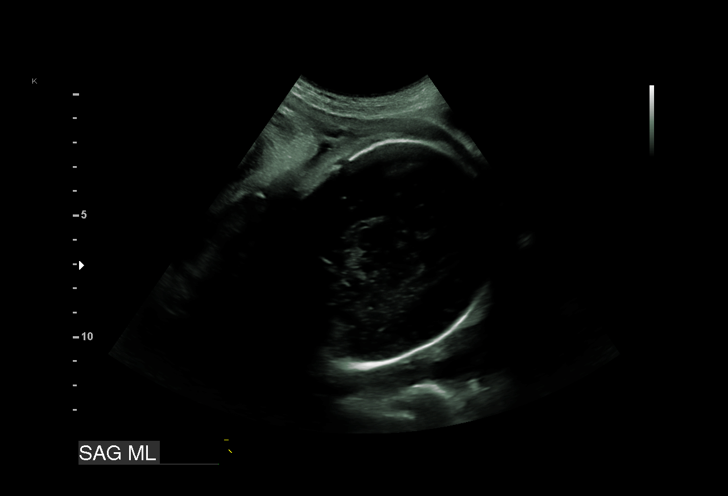
[im 4/42]
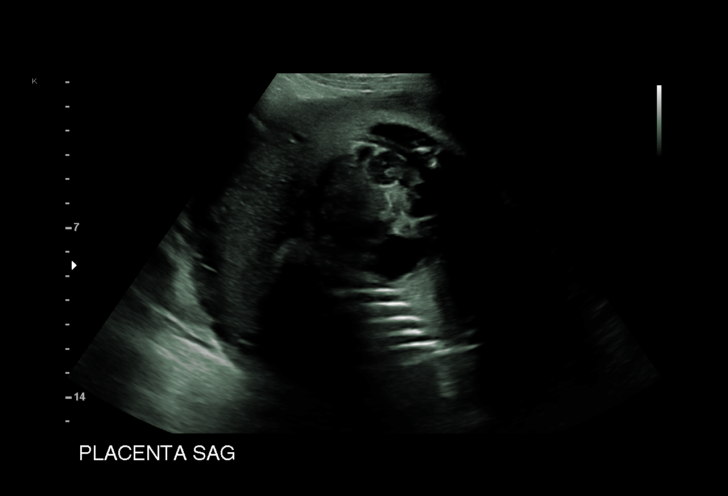
[im 7/42]
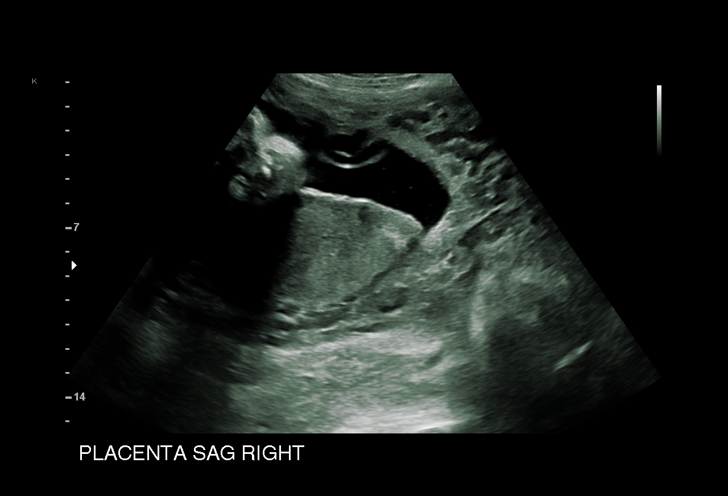
[im 10/42]
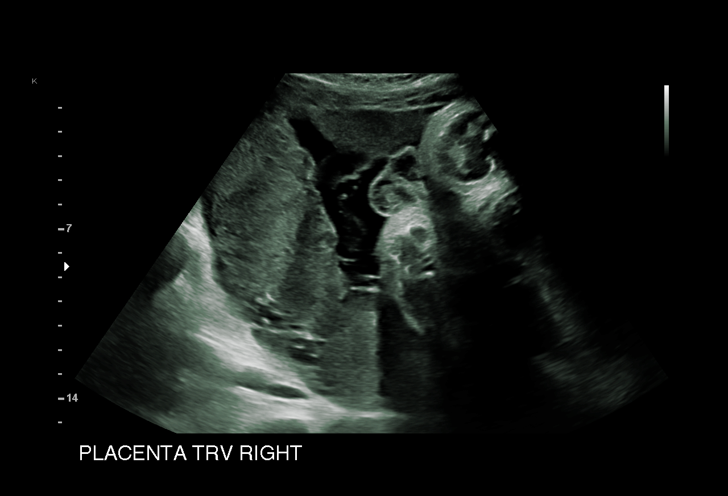
[im 13/42]
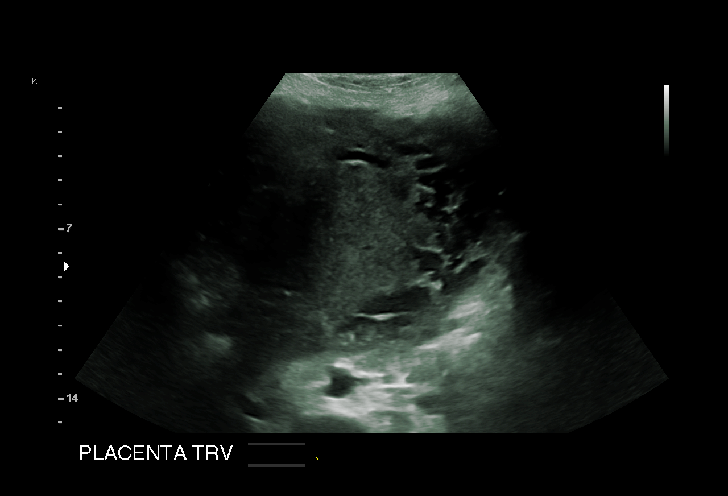
[im 16/42]
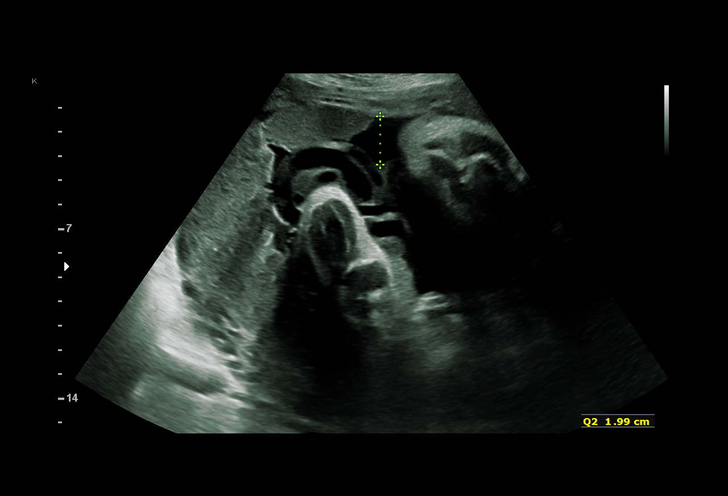
[im 19/42]
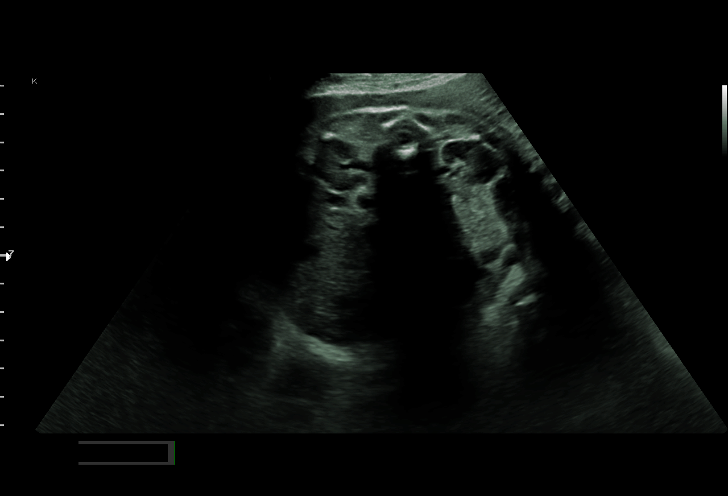
[im 22/42]
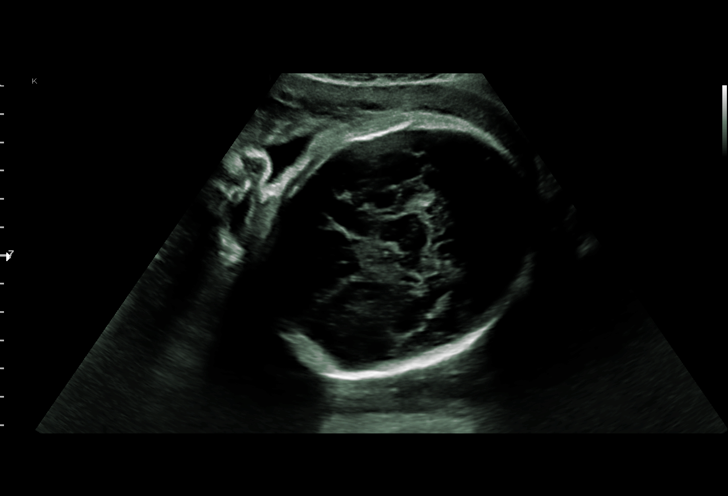
[im 23/42]
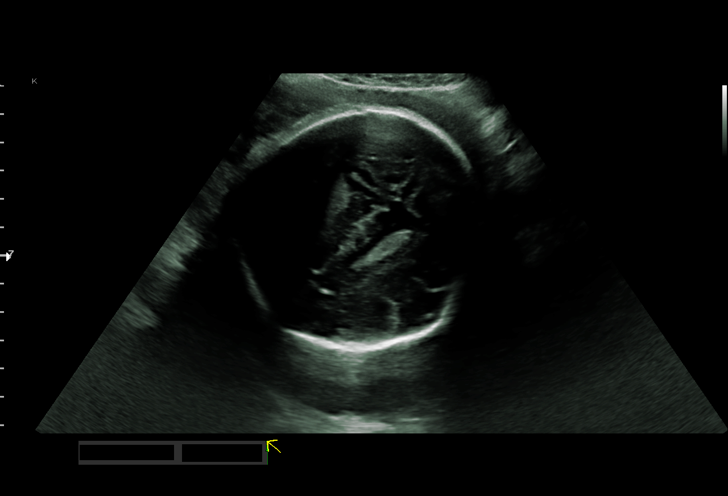
[im 26/42]
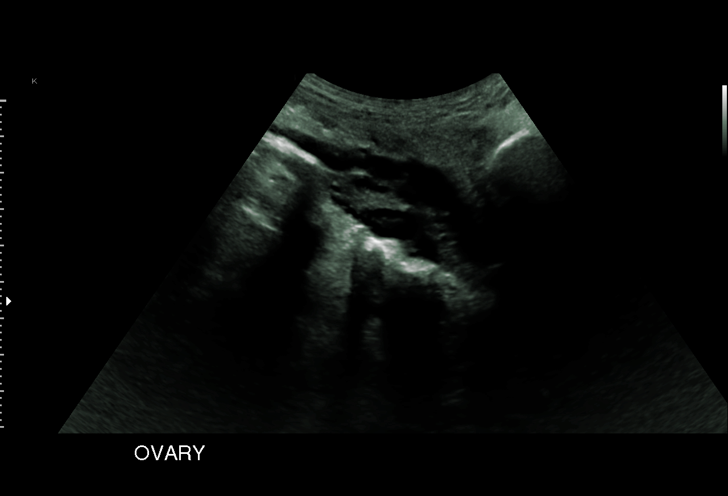
[im 29/42]
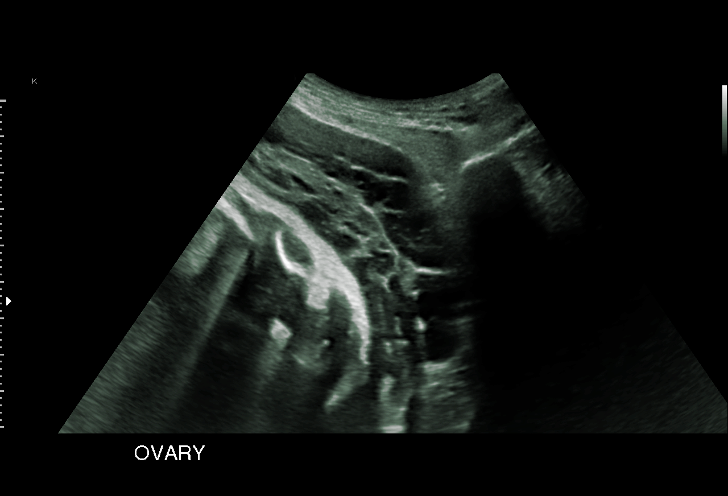
[im 32/42]
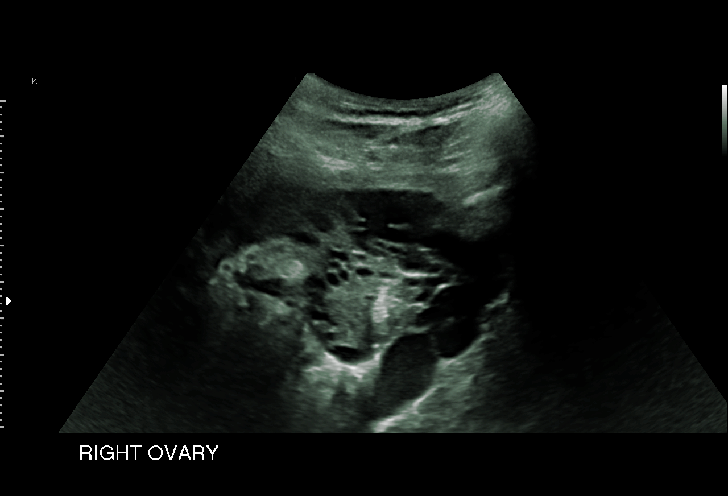
[im 35/42]
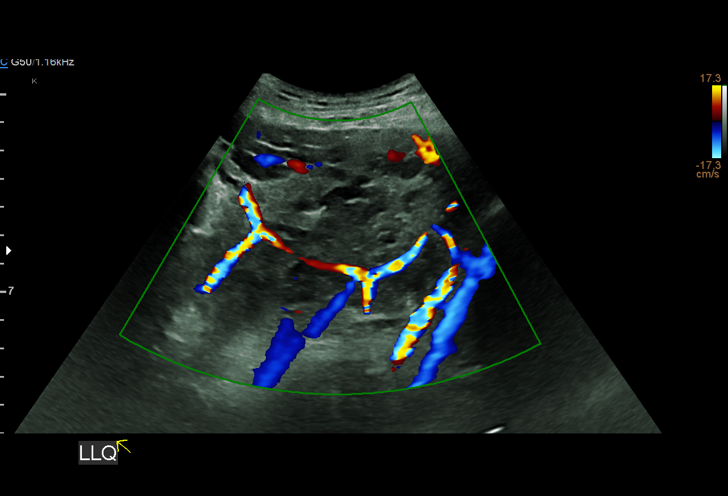
[im 38/42]
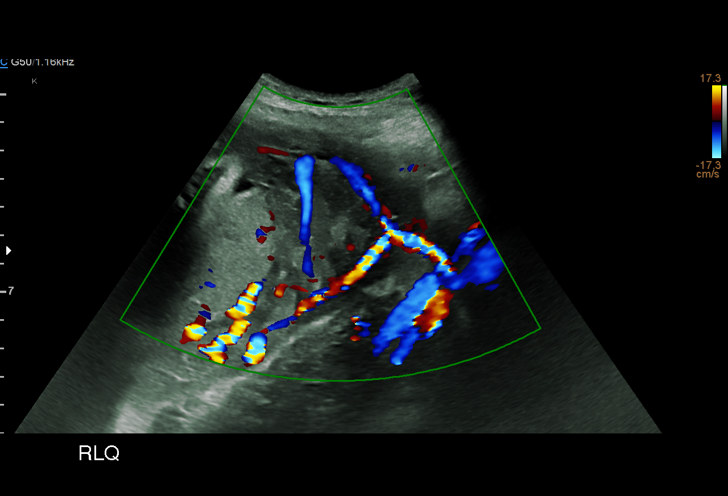
[im 42/42]
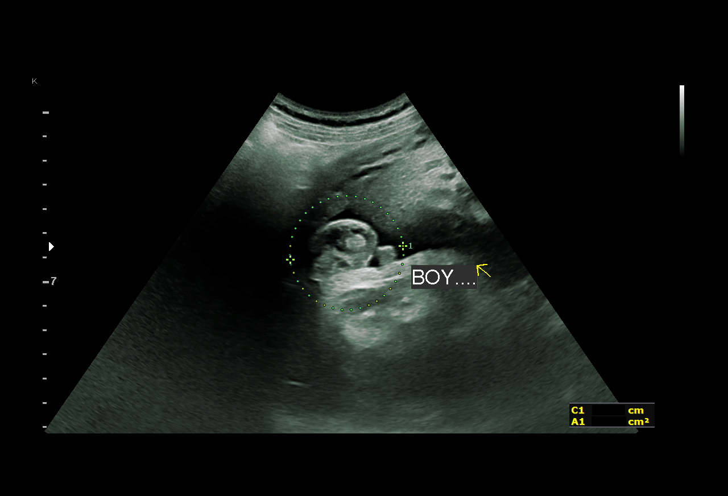

[15 of 28 positions shown; findings below may reference images not displayed]

----------------------------------------------------------------------

 ----------------------------------------------------------------------
Indications

  Abdominal pain in pregnancy - mostly LLQ
  34 weeks gestation of pregnancy
 ----------------------------------------------------------------------
Vital Signs

                                                Height:        5'5"
Fetal Evaluation

 Num Of Fetuses:          1
 Fetal Heart Rate(bpm):   150
 Cardiac Activity:        Observed
 Presentation:            Cephalic
 Placenta:                Posterior RT

 Amniotic Fluid
 AFI FV:      Within normal limits

 Comment:    Portions of placenta are obscurred by fetal parts. Limited
             assessment for abruption.
OB History

 Gravidity:    4         Term:   0         SAB:   3
 Living:       0
Gestational Age

 LMP:           34w 2d        Date:  02/13/18                 EDD:   11/20/18
 Best:          35w 0d     Det. By:  U/S C R L  (05/15/18)    EDD:   11/15/18
Anatomy

 Cranium:               Appears normal         Stomach:                Appears normal, left
                                                                       sided
 Ventricles:            Appears normal         Kidneys:                Appear normal
 Choroid Plexus:        Appears normal         Bladder:                Appears normal
Cervix Uterus Adnexa

 Cervix
 Not visualized (advanced GA >87wks)

 Left Ovary
 Within normal limits.

 Right Ovary
 Within normal limits.
Impression

 Limited exam
 Cannot rule out placental abruption or placenta previa due to
 fetal parts
Recommendations

 Clinical correlation recommended.

## 2022-03-29 ENCOUNTER — Encounter: Payer: Self-pay | Admitting: Radiology

## 2023-07-05 ENCOUNTER — Ambulatory Visit (HOSPITAL_COMMUNITY)
Admission: EM | Admit: 2023-07-05 | Discharge: 2023-07-05 | Disposition: A | Discharge status: Home / Self Care | Attending: Emergency Medicine | Admitting: Emergency Medicine

## 2023-07-05 ENCOUNTER — Encounter (HOSPITAL_COMMUNITY): Payer: Self-pay

## 2023-07-05 DIAGNOSIS — K047 Periapical abscess without sinus: Secondary | ICD-10-CM | POA: Diagnosis not present

## 2023-07-05 DIAGNOSIS — R220 Localized swelling, mass and lump, head: Secondary | ICD-10-CM | POA: Diagnosis not present

## 2023-07-05 MED ORDER — KETOROLAC TROMETHAMINE 60 MG/2ML IM SOLN
30.0000 mg | Freq: Once | INTRAMUSCULAR | Status: AC
  Administered 2023-07-05: 30 mg via INTRAMUSCULAR

## 2023-07-05 MED ORDER — AMOXICILLIN-POT CLAVULANATE 875-125 MG PO TABS: 1.0000 | ORAL_TABLET | Freq: Two times a day (BID) | ORAL | 0 refills | Status: AC

## 2023-07-05 MED ORDER — KETOROLAC TROMETHAMINE 30 MG/ML IJ SOLN
INTRAMUSCULAR | Status: AC
  Filled 2023-07-05: qty 1

## 2023-07-05 NOTE — Discharge Instructions (Signed)
 Take the Augmenting twice daily with food until finished. You can ice your cheek to help with swelling. Avoid chewing on the right side and rinse with warm saline twice daily after eating.  It is important that you follow-up with a dentist, as your dental infections may keep reoccurring until the root of the issue is addressed.  The Toradol we have given you will help with pain and swelling.  Any breakthrough due to pain today can take 500 mg of Tylenol.  Starting tomorrow you can alternate between 800 mg of ibuprofen and 500 mg of Tylenol every 4-6 hours as needed.  Return to clinic for any new or urgent symptoms.

## 2023-07-05 NOTE — ED Triage Notes (Signed)
 Patient reports that she had a tooth fall out 2 months ago and there was a pice of the tooth left. Patient now has right facial swelling and swelling to her right upper gums.  Patient states she has been taking Tylenol for pain.

## 2023-07-05 NOTE — ED Provider Notes (Signed)
 [facility]    CSN: [id number] Arrival date & time: 07/05/23  1105      History   Chief Complaint Chief Complaint  Patient presents with   dental  issue   Facial Swelling    HPI Kristy Little is a 33 y.o. female.   Patient presents to clinic over concern of right upper dental pain.  Last night she was giving oral intercourse and afterwards noticed some pain.  This morning she woke up with right sided facial swelling.  She had broken off one of her molars to her gumline a few months ago and has a piece of the tooth left, this has not been doing her any issues until this morning and last night.  Had oral intercourse last night and is concerned that maybe she has an oral STI.  Does not have a sore throat or tonsillar exudate at this time.  She did take some Tylenol last night, has not had anything today for the pain.  She did try to go to a dentist but they do not have an appointment until tomorrow and she could not wait due to her pain.  The history is provided by the patient and medical records.    Past Medical History:  Diagnosis Date   Chlamydia    Infection    UTI   Migraine     Patient Active Problem List   Diagnosis Date Noted   Swelling of right hand and arm 11/16/2018   Status post primary low transverse cesarean section 11/09/2018   Anemia 11/08/2018   Bipolar affective (HCC) 11/08/2018   Tobacco abuse 11/08/2018    Past Surgical History:  Procedure Laterality Date   CESAREAN SECTION N/A 11/09/2018   Procedure: CESAREAN SECTION;  Surgeon: Reva Bores, MD;  Location: [facility]S;  Service: Obstetrics;  Laterality: N/A;   INDUCED ABORTION      OB History     Gravida  4   Para      Term      Preterm      AB  3   Living  0      SAB  3   IAB  0   Ectopic      Multiple      Live Births               Home Medications    Prior to Admission medications   Medication Sig Start Date End Date Taking? Authorizing Provider   amoxicillin-clavulanate (AUGMENTIN) 875-125 MG tablet Take 1 tablet by mouth every 12 (twelve) hours. 07/05/23  Yes Shelita Steptoe, [place] N, FNP    Family History Family History  Problem Relation Age of Onset   Stroke Mother    Hypertension Mother    Diabetes Paternal Aunt     Social History Social History   Tobacco Use   Smoking status: Former    Types: Cigarettes, Cigars   Smokeless tobacco: Never   Tobacco comments:    2016  Vaping Use   Vaping status: Never Used  Substance Use Topics   Alcohol use: No   Drug use: No     Allergies   Latex   Review of Systems Review of Systems  Per HPI   Physical Exam Triage Vital Signs ED Triage Vitals [07/05/23 1219]  Encounter Vitals Group     BP (!) 122/91     Systolic BP Percentile      Diastolic BP Percentile      Pulse  Rate 65     Resp 14     Temp 98.2 F (36.8 C)     Temp Source Oral     SpO2      Weight      Height      Head Circumference      Peak Flow      Pain Score 0     Pain Loc      Pain Education      Exclude from Growth Chart    No data found.  Updated Vital Signs BP (!) 122/91 (BP Location: Left Arm)   Pulse 65   Temp 98.2 F (36.8 C) (Oral)   Resp 14   LMP 06/28/2023 (Approximate)   Visual Acuity Right Eye Distance:   Left Eye Distance:   Bilateral Distance:    Right Eye Near:   Left Eye Near:    Bilateral Near:     Physical Exam Vitals and nursing note reviewed.  Constitutional:      Appearance: Normal appearance.  HENT:     Head: Normocephalic and atraumatic.     Right Ear: External ear normal.     Left Ear: External ear normal.     Nose: Nose normal.     Mouth/Throat:     Mouth: Mucous membranes are moist.     Dentition: Abnormal dentition. Dental tenderness and dental abscesses present.     Pharynx: Uvula midline.      Comments: Diffuse broken molars to right upper molars, dental decay and an abscess to her gumline.  Eyes:     Conjunctiva/sclera: Conjunctivae normal.   Cardiovascular:     Rate and Rhythm: Normal rate.  Pulmonary:     Effort: Pulmonary effort is normal. No respiratory distress.  Musculoskeletal:        General: Normal range of motion.  Lymphadenopathy:     Cervical: Cervical adenopathy present.  Skin:    General: Skin is warm and dry.  Neurological:     General: No focal deficit present.     Mental Status: She is alert.  Psychiatric:        Behavior: Behavior is cooperative.      UC Treatments / Results  Labs (all labs ordered are listed, but only abnormal results are displayed) Labs Reviewed - No data to display  EKG   Radiology No results found.  Procedures Procedures (including critical care time)  Medications Ordered in UC Medications  ketorolac (TORADOL) injection 30 mg (has no administration in time range)    Initial Impression / Assessment and Plan / UC Course  I have reviewed the triage vital signs and the nursing notes.  Pertinent labs & imaging results that were available during my care of the patient were reviewed by me and considered in my medical decision making (see chart for details).  Vitals and triage reviewed, patient is hemodynamically stable.  Having right sided facial swelling, obvious abscess to right upper gumline with multiple molars broken off to the gumline of the right upper jaw.  Tonsils w/o exudate, low concern for acute STI at this time as oral intercourse was last night. Will cover with Augmentin for dental infection.  Given IM Toradol in clinic to help with pain and swelling.  Encouraged dental follow-up.  Uvula is midline, able to speak freely, low concern for PTA or systemic infection.  Plan of care, follow-up care return precautions given, no questions at this time.     Final Clinical Impressions(s) / UC Diagnoses  Final diagnoses:  Dental abscess  Facial swelling     Discharge Instructions      Take the Augmenting twice daily with food until finished. You can ice your  cheek to help with swelling. Avoid chewing on the right side and rinse with warm saline twice daily after eating.  It is important that you follow-up with a dentist, as your dental infections may keep reoccurring until the root of the issue is addressed.  The Toradol we have given you will help with pain and swelling.  Any breakthrough due to pain today can take 500 mg of Tylenol.  Starting tomorrow you can alternate between 800 mg of ibuprofen and 500 mg of Tylenol every 4-6 hours as needed.  Return to clinic for any new or urgent symptoms.     ED Prescriptions     Medication Sig Dispense Auth. Provider   amoxicillin-clavulanate (AUGMENTIN) 875-125 MG tablet Take 1 tablet by mouth every 12 (twelve) hours. 14 tablet Ariston Grandison, [place], [place]      PDMP not reviewed this encounter.   Elizandro Laura, [place] N, [place] 07/05/23 1300
